# Patient Record
Sex: Male | Born: 1985 | Race: White | Hispanic: No | Marital: Single | Smoking: Never smoker
Health system: Southern US, Community
[De-identification: ages and names within clinical notes are randomized; demographics above are authoritative.]

## PROBLEM LIST (undated history)

## (undated) DIAGNOSIS — Q278 Other specified congenital malformations of peripheral vascular system: Secondary | ICD-10-CM

## (undated) HISTORY — DX: Other specified congenital malformations of peripheral vascular system: Q27.8

## (undated) HISTORY — PX: COLONOSCOPY: SHX174

---

## 2011-06-02 ENCOUNTER — Encounter (HOSPITAL_BASED_OUTPATIENT_CLINIC_OR_DEPARTMENT_OTHER): Payer: Self-pay | Admitting: Emergency Medicine

## 2011-06-02 ENCOUNTER — Emergency Department (HOSPITAL_BASED_OUTPATIENT_CLINIC_OR_DEPARTMENT_OTHER)
Admission: EM | Admit: 2011-06-02 | Discharge: 2011-06-02 | Disposition: A | Discharge status: Home / Self Care | Payer: BC Managed Care – PPO | Attending: Emergency Medicine | Admitting: Emergency Medicine

## 2011-06-02 DIAGNOSIS — R5383 Other fatigue: Secondary | ICD-10-CM | POA: Insufficient documentation

## 2011-06-02 DIAGNOSIS — R5381 Other malaise: Secondary | ICD-10-CM | POA: Insufficient documentation

## 2011-06-02 DIAGNOSIS — R002 Palpitations: Secondary | ICD-10-CM | POA: Insufficient documentation

## 2011-06-02 LAB — COMPREHENSIVE METABOLIC PANEL
ALT: 17 U/L (ref 0–53)
AST: 17 U/L (ref 0–37)
Albumin: 4.6 g/dL (ref 3.5–5.2)
Alkaline Phosphatase: 53 U/L (ref 39–117)
CO2: 28 mEq/L (ref 19–32)
Chloride: 101 mEq/L (ref 96–112)
GFR calc non Af Amer: >90 mL/min (ref >90)
Potassium: 3.8 mEq/L (ref 3.5–5.1)
Total Bilirubin: 0.9 mg/dL (ref 0.3–1.2)

## 2011-06-02 LAB — DIFFERENTIAL
Basophils Absolute: 0 10*3/uL (ref 0.0–0.1)
Basophils Relative: 0 % (ref 0–1)
Lymphocytes Relative: 42 % (ref 12–46)
Monocytes Relative: 8 % (ref 3–12)
Neutro Abs: 3.1 10*3/uL (ref 1.7–7.7)
Neutrophils Relative %: 49 % (ref 43–77)

## 2011-06-02 LAB — CBC
HCT: 43.6 % (ref 39.0–52.0)
Hemoglobin: 15.4 g/dL (ref 13.0–17.0)
MCHC: 35.3 g/dL (ref 30.0–36.0)
RDW: 11.9 % (ref 11.5–15.5)
WBC: 6.4 10*3/uL (ref 4.0–10.5)

## 2011-06-02 NOTE — ED Provider Notes (Signed)
History     CSN: [id number]  Arrival date & time 06/02/11  1715   First MD Initiated Contact with Patient 06/02/11 1821      Chief Complaint  Patient presents with  . Palpitations    (Consider location/radiation/quality/duration/timing/severity/associated sxs/prior treatment) HPI Comments: Noticed low heart rate last week and has been feeling irregularity.  Also feels weak and fatigued.    Patient is a 26 y.o. male presenting with palpitations. The history is provided by the patient.  Palpitations  This is a new problem. Episode onset: one week ago. The problem occurs constantly. The problem has been gradually worsening. The problem is associated with an unknown factor. Associated symptoms include malaise/fatigue and irregular heartbeat. Pertinent negatives include no diaphoresis, no fever and no chest pain. He has tried nothing for the symptoms.    History reviewed. No pertinent past medical history.  History reviewed. No pertinent past surgical history.  No family history on file.  History  Substance Use Topics  . Smoking status: Never Smoker   . Smokeless tobacco: Not on file  . Alcohol Use: No      Review of Systems  Constitutional: Positive for malaise/fatigue and fatigue. Negative for fever and diaphoresis.  Cardiovascular: Positive for palpitations. Negative for chest pain.  All other systems reviewed and are negative.    Allergies  Review of patient's allergies indicates no known allergies.  Home Medications  No current outpatient prescriptions on file.  BP 132/85  Pulse 79  Temp(Src) 97.6 F (36.4 C) (Oral)  Resp 16  Ht 6' (1.829 m)  Wt 165 lb (74.844 kg)  BMI 22.38 kg/m2  SpO2 100%  Physical Exam  Nursing note and vitals reviewed. Constitutional: He is oriented to person, place, and time. He appears well-developed and well-nourished. No distress.  HENT:  Head: Normocephalic and atraumatic.  Neck: Normal range of motion. Neck supple.    Cardiovascular: Normal rate, regular rhythm and normal heart sounds.   No murmur heard. Pulmonary/Chest: Effort normal and breath sounds normal. No respiratory distress. He has no wheezes.  Abdominal: Soft. Bowel sounds are normal. He exhibits no distension. There is no tenderness.  Musculoskeletal: Normal range of motion. He exhibits no edema.  Neurological: He is alert and oriented to person, place, and time. No cranial nerve deficit.  Skin: Skin is warm and dry. He is not diaphoretic.    ED Course  Procedures (including critical care time)   Labs Reviewed  CBC  DIFFERENTIAL  COMPREHENSIVE METABOLIC PANEL  TSH   No results found.   No diagnosis found.   Date: 06/02/2011  Rate: 63  Rhythm: normal sinus rhythm  QRS Axis: normal  Intervals: normal  ST/T Wave abnormalities: normal  Conduction Disutrbances:none  Narrative Interpretation:   Old EKG Reviewed: none available    MDM  The labs and ekg look okay.  There is no evidence of arrhythmia on telemetry.  He will discharged to home, pending tsh.          Geoffery Lyons, MD 06/02/11 2012

## 2011-06-02 NOTE — ED Notes (Addendum)
Pt started having palpitations while working out at the gym 3 months ago.  Denies pain.  Some weakness. Pt noticed his HR at 47 this weekend and got concerned. Feels like his heart beat is irregular.  Skipped beats.  Decided it was time to get checked.

## 2011-06-02 NOTE — Discharge Instructions (Signed)
Palpitations  A palpitation is the feeling that your heartbeat is irregular or is faster than normal. Although this is frightening, it usually is not serious. Palpitations may be caused by excesses of smoking, caffeine, or alcohol. They are also brought on by stress and anxiety. Sometimes, they are caused by heart disease. Unless otherwise noted, your caregiver did not find any signs of serious illness at this time. HOME CARE INSTRUCTIONS  To help prevent palpitations:  Drink decaffeinated coffee, tea, and soda pop. Avoid chocolate.   If you smoke or drink alcohol, quit or cut down as much as possible.   Reduce your stress or anxiety level. Biofeedback, yoga, or meditation will help you relax. Physical activity such as swimming, jogging, or walking also may be helpful.  SEEK MEDICAL CARE IF:   You continue to have a fast heartbeat.   Your palpitations occur more often.  SEEK IMMEDIATE MEDICAL CARE IF: You develop chest pain, shortness of breath, severe headache, dizziness, or fainting. Document Released: 01/29/2000 Document Revised: 01/20/2011 Document Reviewed: 03/30/2007 Exit[employer] Patient Information 2012 [employer]. 

## 2011-06-02 NOTE — ED Notes (Signed)
C/o palpitations working out at gym 3 months ago. Feels shortness of breath on occasion, sometimes feels his heart beat more rapidly at times, checked heart rate with app on phone. Feels fatigue and weakness, denies chest pain, dizziness, nausea, vomiting or diarrhea.

## 2012-05-04 ENCOUNTER — Ambulatory Visit (INDEPENDENT_AMBULATORY_CARE_PROVIDER_SITE_OTHER): Payer: BC Managed Care – PPO | Admitting: Family Medicine

## 2012-05-04 ENCOUNTER — Encounter: Payer: Self-pay | Admitting: Family Medicine

## 2012-05-04 ENCOUNTER — Telehealth: Payer: Self-pay | Admitting: Family Medicine

## 2012-05-04 VITALS — BP 123/72 | HR 90 | Ht 71.0 in | Wt 171.0 lb

## 2012-05-04 DIAGNOSIS — Z131 Encounter for screening for diabetes mellitus: Secondary | ICD-10-CM

## 2012-05-04 DIAGNOSIS — Z13220 Encounter for screening for lipoid disorders: Secondary | ICD-10-CM

## 2012-05-04 DIAGNOSIS — Q2732 Arteriovenous malformation of vessel of lower limb: Secondary | ICD-10-CM | POA: Insufficient documentation

## 2012-05-04 DIAGNOSIS — Z0000 Encounter for general adult medical examination without abnormal findings: Secondary | ICD-10-CM

## 2012-05-04 DIAGNOSIS — Q278 Other specified congenital malformations of peripheral vascular system: Secondary | ICD-10-CM

## 2012-05-04 NOTE — Telephone Encounter (Signed)
Sue Lush, Will you please let Mr. Counsell know that since his mom also had a arteriovenous malformation he is at increased risk for having arteriovenous malformations internally and not just on the knee.  I'd like him to see a vascular specialist to discuss if there's any further workup that needs to be done to ensure there aren't any more of these hiding internally.  It's not emergent, this is more to prevent any surprises that would affect his health well in the future.  A referral has been placed.

## 2012-05-04 NOTE — Telephone Encounter (Signed)
Left detailed message on vm.

## 2012-05-04 NOTE — Progress Notes (Signed)
CC: Troy Oconnell is a 27 y.o. male is here for Establish Care  Colonoscopy: No family history colon Cancer, screening to start age 5 Prostate: Discussed screening risks/beneifts with patient on 05/04/2012. No family history of prostate cancer, we'll consider screening age 80  Influenza Vaccine: Out of season Pneumovax: not indicated Td/Tdap: Td 2008 therefore up-to-date  Subjective: HPI:  Patient presents for complete physical exam without complaints.  Exercises regularly most days of the week, eating a varied diet low in saturated fats plentiful with vegetables and fiber.  Rare alcohol use, no tobacco or recreational drug use  Over the past 2 weeks have you been bothered by: - Little interest or pleasure in doing things: no - Feeling down depressed or hopeless: no  Review of Systems - General ROS: negative for - chills, fever, night sweats, weight gain or weight loss Ophthalmic ROS: negative for - decreased vision Psychological ROS: negative for - anxiety or depression ENT ROS: negative for - hearing change, nasal congestion, tinnitus or allergies Hematological and Lymphatic ROS: negative for - bleeding problems, bruising or swollen lymph nodes Breast ROS: negative Respiratory ROS: no cough, shortness of breath, or wheezing Cardiovascular ROS: no chest pain or dyspnea on exertion Gastrointestinal ROS: no abdominal pain, change in bowel habits, or black or bloody stools Genito-Urinary ROS: negative for - genital discharge, genital ulcers, incontinence or abnormal bleeding from genitals Musculoskeletal ROS: negative for - joint pain or muscle pain Neurological ROS: negative for - headaches or memory loss Dermatological ROS: negative for lumps, mole changes, rash and skin lesion changes  History reviewed. No pertinent past medical history.   Family History  Problem Relation Age of Onset  . AVM Mother      History  Substance Use Topics  . Smoking status: Never Smoker   .  Smokeless tobacco: Not on file  . Alcohol Use: No     Objective: Filed Vitals:   05/04/12 1424  BP: 123/72  Pulse: 90   General: No Acute Distress HEENT: Atraumatic, normocephalic, conjunctivae normal without scleral icterus.  No nasal discharge, hearing grossly intact, TMs with good landmarks bilaterally with no middle ear abnormalities, posterior pharynx clear without oral lesions. Neck: Supple, trachea midline, no cervical nor supraclavicular adenopathy. Pulmonary: Clear to auscultation bilaterally without wheezing, rhonchi, nor rales. Cardiac: Regular rate and rhythm.  No murmurs, rubs, nor gallops. No peripheral edema.  2+ peripheral pulses bilaterally. Abdomen: Bowel sounds normal.  No masses.  Non-tender without rebound.  Negative Murphy's sign. GU: Deferred by patient MSK: Grossly intact, no signs of weakness.  Full strength throughout upper and lower extremities.  Full ROM in upper and lower extremities.  No midline spinal tenderness. Neuro: Gait unremarkable, CN II-XII grossly intact.  C5-C6 Reflex 2/4 Bilaterally, L4 Reflex 2/4 Bilaterally.  Cerebellar function intact. Skin: No rashes. 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  Thought process normal. No anxiety/depression.  Assessment & Plan: [name] was seen today for establish care.  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 I encouraged him to  do self  testicular exam on a monthly basis we discussed how to properly do this.  Given a family history of AVM and his enlarging AVM on the surface of his knee I would like for him to be seen by vascular surgery to help determine if he needs any workup for internal AVMs.  Has never been screened for type 2 diabetes nor dyslipidemia therefore will obtain this fasting at his convenience  Return in about 3 months (around 08/04/2012).

## 2012-05-04 NOTE — Patient Instructions (Addendum)
Dr. Rosabella Edgin's General Advice Following Your Complete Physical Exam  The Benefits of Regular Exercise: Unless you suffer from an uncontrolled cardiovascular condition, studies strongly suggest that regular exercise and physical activity will add to both the quality and length of your life.  The World Health Organization recommends 150 minutes of moderate intensity aerobic activity every week.  This is best split over 3-4 days a week, and can be as simple as a brisk walk for just over 35 minutes "most days of the week".  This type of exercise has been shown to lower LDL-Cholesterol, lower average blood sugars, lower blood pressure, lower cardiovascular disease risk, improve memory, and increase one's overall sense of wellbeing.  The addition of anaerobic (or "strength training") exercises offers additional benefits including but not limited to increased metabolism, prevention of osteoporosis, and improved overall cholesterol levels.  How Can I Strive For A Low-Fat Diet?: Current guidelines recommend that 25-35 percent of your daily energy (food) intake should come from fats.  One might ask how can this be achieved without having to dissect each meal on a daily basis?  Switch to skim or 1% milk instead of whole milk.  Focus on lean meats such as ground turkey, fresh fish, baked chicken, and lean cuts of beef as your source of dietary protein.  Consume less than 300mg/day of dietary cholesterol.  Limit trans fatty acid consumption primarily by limiting synthetic trans fats such as partially hydrogenated oils (Ex: fried fast foods).  Focus efforts on reducing your intake of "solid" fats (Ex: Butter).  Substitute olive or vegetable oil for solid fats where possible.  Moderation of Salt Intake: Provided you don't carry a diagnosis of congestive heart failure nor renal failure, I recommend a daily allowance of no more than 2300 mg of salt (sodium).  Keeping under this daily goal is associated with a  decreased risk of cardiovascular events, creeping above it can lead to elevated blood pressures and increases your risk of cardiovascular events.  Milligrams (mg) of salt is listed on all nutrition labels, and your daily intake can add up faster than you think.  Most canned and frozen dinners can pack in over half your daily salt allowance in one meal.    Lifestyle Health Risks: Certain lifestyle choices carry specific health risks.  As you may already know, tobacco use has been associated with increasing one's risk of cardiovascular disease, pulmonary disease, numerous cancers, among many other issues.  What you may not know is that there are medications and nicotine replacement strategies that can more than double your chances of successfully quitting.  I would be thrilled to help manage your quitting strategy if you currently use tobacco products.  When it comes to alcohol use, I've yet to find an "ideal" daily allowance.  Provided an individual does not have a medical condition that is exacerbated by alcohol consumption, general guidelines determine "safe drinking" as no more than two standard drinks for a man or no more than one standard drink for a male per day.  However, much debate still exists on whether any amount of alcohol consumption is technically "safe".  My general advice, keep alcohol consumption to a minimum for general health promotion.  If you or others believe that alcohol, tobacco, or recreational drug use is interfering with your life, I would be happy to provide confidential counseling regarding treatment options.  General "Over The Counter" Nutrition Advice: Postmenopausal women should aim for a daily calcium intake of 1200 mg, however a significant   portion of this might already be provided by diets including milk, yogurt, cheese, and other dairy products.  Vitamin D has been shown to help preserve bone density, prevent fatigue, and has even been shown to help reduce falls in the  elderly.  Ensuring a daily intake of 800 Units of Vitamin D is a good place to start to enjoy the above benefits, we can easily check your Vitamin D level to see if you'd potentially benefit from supplementation beyond 800 Units a day.  Folic Acid intake should be of particular concern to women of childbearing age.  Daily consumption of 400-800 mcg of Folic Acid is recommended to minimize the chance of spinal cord defects in a fetus should pregnancy occur.    For many adults, accidents still remain one of the most common culprits when it comes to cause of death.  Some of the simplest but most effective preventitive habits you can adopt include regular seatbelt use, proper helmet use, securing firearms, and regularly testing your smoke and carbon monoxide detectors.  Jamesina Gaugh B. Malaki Koury DO [facility] [address], [address] [place], [place] [address] Phone: [phone]  

## 2012-05-30 ENCOUNTER — Encounter: Payer: Self-pay | Admitting: Vascular Surgery

## 2012-06-04 ENCOUNTER — Encounter: Payer: Self-pay | Admitting: Vascular Surgery

## 2012-06-04 LAB — BASIC METABOLIC PANEL WITH GFR
BUN: 12 mg/dL (ref 6–23)
GFR, Est African American: >89 mL/min
GFR, Est Non African American: >89 mL/min
Potassium: 4.2 mEq/L (ref 3.5–5.3)
Sodium: 141 mEq/L (ref 135–145)

## 2012-06-04 LAB — LIPID PANEL
LDL Cholesterol: 94 mg/dL (ref 0–99)
Total CHOL/HDL Ratio: 3.6 Ratio
VLDL: 23 mg/dL (ref 0–40)

## 2012-06-05 ENCOUNTER — Encounter: Payer: Self-pay | Admitting: Vascular Surgery

## 2012-06-05 ENCOUNTER — Ambulatory Visit (INDEPENDENT_AMBULATORY_CARE_PROVIDER_SITE_OTHER): Payer: BC Managed Care – PPO | Admitting: Vascular Surgery

## 2012-06-05 VITALS — BP 118/79 | HR 57 | Resp 14 | Ht 71.0 in | Wt 170.0 lb

## 2012-06-05 DIAGNOSIS — S81001A Unspecified open wound, right knee, initial encounter: Secondary | ICD-10-CM

## 2012-06-05 DIAGNOSIS — S81009A Unspecified open wound, unspecified knee, initial encounter: Secondary | ICD-10-CM | POA: Insufficient documentation

## 2012-06-05 DIAGNOSIS — S81809A Unspecified open wound, unspecified lower leg, initial encounter: Secondary | ICD-10-CM

## 2012-06-05 DIAGNOSIS — S91009A Unspecified open wound, unspecified ankle, initial encounter: Secondary | ICD-10-CM

## 2012-06-05 NOTE — Progress Notes (Signed)
The patient presents today for evaluation of a small superficial AV malformation on his right knee. He reports this is been present since birth. He has no pain or swelling associated with this. He has no other evidence of malformations. He has had a number of occasions where he had significant bleeding from superficial abrasion over this AV malformation. Most recently he had several different episodes over the course of one to 2 weeks this was about 6 months ago. He does not have any lower extremity swelling or discomfort..  Past Medical History  Diagnosis Date  . Congenital lower limb vessel anomaly     Arteriovenous malformation of lower extremity    History  Substance Use Topics  . Smoking status: Never Smoker   . Smokeless tobacco: Never Used  . Alcohol Use: No    Family History  Problem Relation Age of Onset  . AVM Mother     No Known Allergies  No current outpatient prescriptions on file.  BP 118/79  Pulse 57  Resp 14  Ht 5\' 11"  (1.803 m)  Wt 170 lb (77.111 kg)  BMI 23.72 kg/m2  SpO2 100%  Body mass index is 23.72 kg/(m^2).       Review of systems is totally negative aside from bleeding from this area.  Physical exam: Well-developed well-nourished gentleman appearing stated age in no distress Neurologically he is grossly intact Skin without ulcers or rashes. Does have the area of concern on his right knee Pulse status 2+ radial 2+ dorsalis pedis pulses bilaterally Psychologic normal affect. Respirations nonlabored He does have a proximally 2 cm area of well-demarcated a reddish AV malformation. This has a slight area of raised above the skin surface on the upper and medial aspect. Does have some palpable plexus of veins underneath this.  I imaged this area with SonoSite ultrasound does show plexus of veins beneath this. These are and not particularly enlarged and there does not appear to be any other extension of this with his screening exam.  Impression and  plan superficial small AV malformation on the right knee. I reassured the patient that this should be self-limited aggravation more than any other problem. I did explain that he had recurrent evidence absence of bleeding that this could potentially be treated with sclerotherapy of the veins underlying this. I explained that I had not done this before but these a small vein should be treatable by this method. 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 I would certain feel comfortable with observation only since he does not have any other issue he will notify should he develop recurrent bleeding episodes or other concerns or so we will attempt sclerotherapy. He was reassured this discussion

## 2012-12-20 ENCOUNTER — Other Ambulatory Visit: Payer: Self-pay

## 2013-01-29 ENCOUNTER — Other Ambulatory Visit: Payer: Self-pay | Admitting: Family Medicine

## 2013-01-29 ENCOUNTER — Ambulatory Visit (INDEPENDENT_AMBULATORY_CARE_PROVIDER_SITE_OTHER): Payer: BC Managed Care – PPO | Admitting: Family Medicine

## 2013-01-29 ENCOUNTER — Encounter: Payer: Self-pay | Admitting: Family Medicine

## 2013-01-29 VITALS — BP 121/70 | HR 73 | Temp 97.6°F | Wt 165.0 lb

## 2013-01-29 DIAGNOSIS — R300 Dysuria: Secondary | ICD-10-CM

## 2013-01-29 DIAGNOSIS — N390 Urinary tract infection, site not specified: Secondary | ICD-10-CM

## 2013-01-29 LAB — POCT URINALYSIS DIPSTICK
Glucose, UA: NEGATIVE
Nitrite, UA: NEGATIVE
Urobilinogen, UA: 0.2

## 2013-01-29 MED ORDER — CIPROFLOXACIN HCL 500 MG PO TABS: 500.0000 mg | ORAL_TABLET | Freq: Two times a day (BID) | ORAL | Status: AC

## 2013-01-29 NOTE — Addendum Note (Signed)
Addended by: Wyline Beady on: 01/29/2013 12:41 PM   Modules accepted: Orders

## 2013-01-29 NOTE — Progress Notes (Signed)
CC: Troy Oconnell is a 27 y.o. male is here for Dysuria   Subjective: HPI:  Complains of one week of dysuria localized to the tip of the penis worse first thing in the morning and improves to more water he drinks throughout the day. He does admit that the week prior to onset of symptoms he was drinking very little fluids on a daily basis urinating to-3 times in a 24-hour period. Has been accompanied by clear discharge at the tip of the penis which comes and goes over the past 2 weeks. Dysuria is described as burning which is nonradiating. Denies genital lesions, testicular pain or swelling, groin pain, rectal pain, nor discomfort in the saddle region. He has been sexually active but does not know of any known STI encounters. Denies flank pain, fevers, chills, abdominal pain, pelvic pain, nor night sweats.   Review Of Systems Outlined In HPI  Past Medical History  Diagnosis Date  . Congenital lower limb vessel anomaly     Arteriovenous malformation of lower extremity     Family History  Problem Relation Age of Onset  . AVM Mother      History  Substance Use Topics  . Smoking status: Never Smoker   . Smokeless tobacco: Never Used  . Alcohol Use: No     Objective: Filed Vitals:   01/29/13 1121  BP: 121/70  Pulse: 73  Temp: 97.6 F (36.4 C)    Vital signs reviewed. General: Alert and Oriented, No Acute Distress HEENT: Pupils equal, round, reactive to light. Conjunctivae clear.  External ears unremarkable.  Moist mucous membranes. Lungs: Clear and comfortable work of breathing, speaking in full sentences without accessory muscle use. Cardiac: Regular rate and rhythm.  Neuro: CN II-XII grossly intact, gait normal. Extremities: No peripheral edema.  Strong peripheral pulses.  Mental Status: No depression, anxiety, nor agitation. Logical though process. Skin: Warm and dry. Assessment & Plan: Troy Oconnell was seen today for dysuria.  Diagnoses and associated orders for this  visit:  Dysuria - Urinalysis Dipstick - Urine Culture  UTI (lower urinary tract infection) - ciprofloxacin (CIPRO) 500 MG tablet; Take 1 tablet (500 mg total) by mouth 2 (two) times daily.    Dysuria: Urinalysis with trace urine and leukocytes suspicious for urinary tract infection we'll cover for enteric bacteria pending gonorrhea and Chlamydia probe, will follow urine culture.  Low suspicion of prostatis.  Return if symptoms worsen or fail to improve.

## 2013-01-30 LAB — URINE CULTURE: Organism ID, Bacteria: NO GROWTH

## 2013-01-31 ENCOUNTER — Encounter: Payer: Self-pay | Admitting: Family Medicine

## 2013-02-01 ENCOUNTER — Telehealth: Payer: Self-pay | Admitting: Family Medicine

## 2013-02-01 DIAGNOSIS — A749 Chlamydial infection, unspecified: Secondary | ICD-10-CM

## 2013-02-01 MED ORDER — AZITHROMYCIN 500 MG PO TABS: ORAL_TABLET | ORAL | Status: DC

## 2013-02-01 NOTE — Telephone Encounter (Signed)
Pt.notified

## 2013-02-01 NOTE — Telephone Encounter (Signed)
Sue Lush, Will you please let Troy Oconnell know that his Chlamydia test came back positive, this totally explains all of his symptoms.  Cipro does not work for this bacteria, he'll only need a single dose of two azithromycin pills that I sent to his [facility].  He should be feeling much better by Sunday/Monday

## 2013-02-01 NOTE — Telephone Encounter (Signed)
They are faxing the results now.

## 2013-02-01 NOTE — Telephone Encounter (Signed)
Sue Lush, Can you please check with [facility] to see if they can confirm that they are running a test for chlamydia that was added yesterday.  I don't see any evidence of it yet.

## 2013-05-04 ENCOUNTER — Encounter: Payer: Self-pay | Admitting: Family Medicine

## 2013-06-14 ENCOUNTER — Ambulatory Visit: Payer: Self-pay | Admitting: Family Medicine

## 2013-09-10 ENCOUNTER — Ambulatory Visit (INDEPENDENT_AMBULATORY_CARE_PROVIDER_SITE_OTHER): Payer: BC Managed Care – PPO | Admitting: Family Medicine

## 2013-09-10 ENCOUNTER — Encounter: Payer: Self-pay | Admitting: Family Medicine

## 2013-09-10 VITALS — BP 127/64 | HR 64 | Ht 71.0 in | Wt 163.0 lb

## 2013-09-10 DIAGNOSIS — L821 Other seborrheic keratosis: Secondary | ICD-10-CM

## 2013-09-10 DIAGNOSIS — Z0000 Encounter for general adult medical examination without abnormal findings: Secondary | ICD-10-CM

## 2013-09-10 NOTE — Patient Instructions (Signed)
Dr. Marcelle Hepner's General Advice Following Your Complete Physical Exam  The Benefits of Regular Exercise: Unless you suffer from an uncontrolled cardiovascular condition, studies strongly suggest that regular exercise and physical activity will add to both the quality and length of your life.  The World Health Organization recommends 150 minutes of moderate intensity aerobic activity every week.  This is best split over 3-4 days a week, and can be as simple as a brisk walk for just over 35 minutes "most days of the week".  This type of exercise has been shown to lower LDL-Cholesterol, lower average blood sugars, lower blood pressure, lower cardiovascular disease risk, improve memory, and increase one's overall sense of wellbeing.  The addition of anaerobic (or "strength training") exercises offers additional benefits including but not limited to increased metabolism, prevention of osteoporosis, and improved overall cholesterol levels.  How Can I Strive For A Low-Fat Diet?: Current guidelines recommend that 25-35 percent of your daily energy (food) intake should come from fats.  One might ask how can this be achieved without having to dissect each meal on a daily basis?  Switch to skim or 1% milk instead of whole milk.  Focus on lean meats such as ground turkey, fresh fish, baked chicken, and lean cuts of beef as your source of dietary protein.  Limit saturated fat consumption to less than 10% of your daily caloric intake.  Limit trans fatty acid consumption primarily by limiting synthetic trans fats such as partially hydrogenated oils (Ex: fried fast foods).  Substitute olive or vegetable oil for solid fats where possible.  Moderation of Salt Intake: Provided you don't carry a diagnosis of congestive heart failure nor renal failure, I recommend a daily allowance of no more than 2300 mg of salt (sodium).  Keeping under this daily goal is associated with a decreased risk of cardiovascular events, creeping  above it can lead to elevated blood pressures and increases your risk of cardiovascular events.  Milligrams (mg) of salt is listed on all nutrition labels, and your daily intake can add up faster than you think.  Most canned and frozen dinners can pack in over half your daily salt allowance in one meal.    Lifestyle Health Risks: Certain lifestyle choices carry specific health risks.  As you may already know, tobacco use has been associated with increasing one's risk of cardiovascular disease, pulmonary disease, numerous cancers, among many other issues.  What you may not know is that there are medications and nicotine replacement strategies that can more than double your chances of successfully quitting.  I would be thrilled to help manage your quitting strategy if you currently use tobacco products.  When it comes to alcohol use, I've yet to find an "ideal" daily allowance.  Provided an individual does not have a medical condition that is exacerbated by alcohol consumption, general guidelines determine "safe drinking" as no more than two standard drinks for a man or no more than one standard drink for a male per day.  However, much debate still exists on whether any amount of alcohol consumption is technically "safe".  My general advice, keep alcohol consumption to a minimum for general health promotion.  If you or others believe that alcohol, tobacco, or recreational drug use is interfering with your life, I would be happy to provide confidential counseling regarding treatment options.  General "Over The Counter" Nutrition Advice: Postmenopausal women should aim for a daily calcium intake of 1200 mg, however a significant portion of this might already be   provided by diets including milk, yogurt, cheese, and other dairy products.  Vitamin D has been shown to help preserve bone density, prevent fatigue, and has even been shown to help reduce falls in the elderly.  Ensuring a daily intake of 800 Units of  Vitamin D is a good place to start to enjoy the above benefits, we can easily check your Vitamin D level to see if you'd potentially benefit from supplementation beyond 800 Units a day.  Folic Acid intake should be of particular concern to women of childbearing age.  Daily consumption of 400-800 mcg of Folic Acid is recommended to minimize the chance of spinal cord defects in a fetus should pregnancy occur.    For many adults, accidents still remain one of the most common culprits when it comes to cause of death.  Some of the simplest but most effective preventitive habits you can adopt include regular seatbelt use, proper helmet use, securing firearms, and regularly testing your smoke and carbon monoxide detectors.  Senora Lacson B. Quinteria Chisum DO [facility] [address], [address] [place], [place] [address] Phone: [phone]  

## 2013-09-10 NOTE — Progress Notes (Signed)
CC: Barb MerinoMohammad Oconnell is a 28 y.o. male is here for Annual Exam   Subjective: HPI:  Colonoscopy: No family history of colon cancer will begin screening at age 28 Prostate: Discussed screening risks/beneifts with patient today, will begin at age 28. No family history of prostate cancer  Influenza Vaccine: Encouraged him to consider having this done the flu season arrives Pneumovax: No current indication Td/Tdap: Up-to-date as of 2008 Zoster: (Start 26 yo)  Requesting complete physical exam with concerns about a mole on his back that has been present for an undetermined amount of time is unsure but has been getting bigger or worse but it is painless.  No alcohol tobacco or recreational drug use  Review of Systems - General ROS: negative for - chills, fever, night sweats, weight gain or weight loss Ophthalmic ROS: negative for - decreased vision Psychological ROS: negative for - anxiety or depression ENT ROS: negative for - hearing change, nasal congestion, tinnitus or allergies Hematological and Lymphatic ROS: negative for - bleeding problems, bruising or swollen lymph nodes Breast ROS: negative Respiratory ROS: no cough, shortness of breath, or wheezing Cardiovascular ROS: no chest pain or dyspnea on exertion Gastrointestinal ROS: no abdominal pain, change in bowel habits, or black or bloody stools Genito-Urinary ROS: negative for - genital discharge, genital ulcers, incontinence or abnormal bleeding from genitals Musculoskeletal ROS: negative for - joint pain or muscle pain Neurological ROS: negative for - headaches or memory loss Dermatological ROS: negative for lumps, mole changes, rash and skin lesion changes other than that described above  Past Medical History  Diagnosis Date  . Congenital lower limb vessel anomaly     Arteriovenous malformation of lower extremity    Past Surgical History  Procedure Laterality Date  . Colonoscopy     Family History  Problem Relation Age of  Onset  . AVM Mother     History   Social History  . Marital Status: Single    Spouse Name: N/A    Number of Children: N/A  . Years of Education: N/A   Occupational History  . Not on file.   Social History Main Topics  . Smoking status: Never Smoker   . Smokeless tobacco: Never Used  . Alcohol Use: No  . Drug Use: No  . Sexual Activity: Not on file   Other Topics Concern  . Not on file   Social History Narrative  . No narrative on file     Objective: BP 127/64  Pulse 64  Ht 5\' 11"  (1.803 m)  Wt 163 lb (73.936 kg)  BMI 22.74 kg/m2  General: No Acute Distress HEENT: Atraumatic, normocephalic, conjunctivae normal without scleral icterus.  No nasal discharge, hearing grossly intact, TMs with good landmarks bilaterally with no middle ear abnormalities, posterior pharynx clear without oral lesions. Neck: Supple, trachea midline, no cervical nor supraclavicular adenopathy. Pulmonary: Clear to auscultation bilaterally without wheezing, rhonchi, nor rales. Cardiac: Regular rate and rhythm.  No murmurs, rubs, nor gallops. No peripheral edema.  2+ peripheral pulses bilaterally. Abdomen: Bowel sounds normal.  No masses.  Non-tender without rebound.  Negative Murphy's sign. GU: Bilaterally descended testes no hernia MSK: Grossly intact, no signs of weakness.  Full strength throughout upper and lower extremities.  Full ROM in upper and lower extremities.  No midline spinal tenderness. Neuro: Gait unremarkable, CN II-XII grossly intact.  C5-C6 Reflex 2/4 Bilaterally, L4 Reflex 2/4 Bilaterally.  Cerebellar function intact. Skin: No rashes. Noninflamed seborrheic keratosis between the shoulder blades Psych: Alert and  oriented to person/place/time.  Thought process normal. No anxiety/depression.  Assessment & Plan: Sylvanus was seen today for annual exam.  Diagnoses and associated orders for this visit:  Annual physical exam  Seborrheic keratoses    Healthy lifestyle  interventions including but not limited to regular exercise, a healthy low fat diet, moderation of salt intake, the dangers of tobacco/alcohol/recreational drug use, nutrition supplementation, and accident avoidance were discussed with the patient and a handout was provided for future reference. Discussed benign nature of seborrheic keratosis and that if it is ever painful due to friction we can have it removed/destruction with cryotherapy   Return if symptoms worsen or fail to improve.

## 2014-08-19 ENCOUNTER — Emergency Department
Admission: EM | Admit: 2014-08-19 | Discharge: 2014-08-19 | Disposition: A | Discharge status: Home / Self Care | Payer: BLUE CROSS/BLUE SHIELD | Source: Home / Self Care | Attending: Emergency Medicine | Admitting: Emergency Medicine

## 2014-08-19 ENCOUNTER — Encounter: Payer: Self-pay | Admitting: *Deleted

## 2014-08-19 ENCOUNTER — Encounter: Payer: Self-pay | Admitting: Family Medicine

## 2014-08-19 DIAGNOSIS — M25422 Effusion, left elbow: Secondary | ICD-10-CM | POA: Diagnosis not present

## 2014-08-19 NOTE — ED Provider Notes (Signed)
CSN: [id number]     Arrival date & time 08/19/14  1819 History   First MD Initiated Contact with Patient 08/19/14 1833     Chief Complaint  Patient presents with  . Arm Pain   (Consider location/radiation/quality/duration/timing/severity/associated sxs/prior Treatment) Patient is a 29 y.o. male presenting with arm pain. The history is provided by the patient. No language interpreter was used.  Arm Pain This is a new problem. Episode onset: 6/23  The problem occurs constantly. The problem has been gradually improving. Nothing aggravates the symptoms. Nothing relieves the symptoms. He has tried nothing for the symptoms.  Pt reports he gave blood  Arm began swelling and bruising 2 days later.   Pt reports area in upper arm is swollen and tender.  Pt reports some tingling in his fingers.    Past Medical History  Diagnosis Date  . Congenital lower limb vessel anomaly     Arteriovenous malformation of lower extremity   Past Surgical History  Procedure Laterality Date  . Colonoscopy     Family History  Problem Relation Age of Onset  . AVM Mother    History  Substance Use Topics  . Smoking status: Never Smoker   . Smokeless tobacco: Never Used  . Alcohol Use: No    Review of Systems  Musculoskeletal: Positive for myalgias and joint swelling.  All other systems reviewed and are negative.   Allergies  Review of patient's allergies indicates no known allergies.  Home Medications   Prior to Admission medications   Not on File   BP 135/85 mmHg  Pulse 61  Temp(Src) 98.1 F (36.7 C) (Oral)  Resp 16  Ht 5\' 10"  (1.778 m)  Wt 170 lb (77.111 kg)  BMI 24.39 kg/m2  SpO2 100% Physical Exam  Constitutional: He is oriented to person, place, and time. He appears well-developed and well-nourished.  HENT:  Head: Normocephalic.  Cardiovascular: Normal rate.   Pulmonary/Chest: Effort normal.  Musculoskeletal: He exhibits tenderness.  Swollen left arm.  Tender bicep, swollen bruised  above blood draw site and below.  nv intact, good cap refill,    Neurological: He is alert and oriented to person, place, and time. He has normal reflexes.  Skin: Skin is warm.  Psychiatric: He has a normal mood and affect.    ED Course  Procedures (including critical care time) Labs Review Labs Reviewed - No data to display  Imaging Review No results found.   MDM   1. Swelling of joint of upper arm, left    Pt needs to return for ultrasound.   I think he probably has a superficial thrombophlebitis however swelling in upper arm is concerning. Pt advised asa tonight.  Warm compresses avs      Lonia SkinnerLeslie K Snow HillSofia, New JerseyPA-C 08/19/14 1857

## 2014-08-19 NOTE — Discharge Instructions (Signed)
Phlebitis °Phlebitis is soreness and swelling (inflammation) of a vein. This can occur in your arms, legs, or torso (trunk), as well as deeper inside your body. Phlebitis is usually not serious when it occurs close to the surface of the body. However, it can cause serious problems when it occurs in a vein deeper inside the body. °CAUSES  °Phlebitis can be triggered by various things, including:  °· Reduced blood flow through your veins. This can happen with: °¨ Bed rest over a long period. °¨ Long-distance travel. °¨ Injury. °¨ Surgery. °¨ Being overweight (obese) or pregnant. °· Having an IV tube put in the vein and getting certain medicines through the vein. °· Cancer and cancer treatment. °· Use of illegal drugs taken through the vein. °· Inflammatory diseases. °· Inherited (genetic) diseases that increase the risk of blood clots. °· Hormone therapy, such as birth control pills. °SIGNS AND SYMPTOMS  °· Red, tender, swollen, and painful area on your skin. Usually, the area will be long and narrow. °· Firmness along the center of the affected area. This can indicate that a blood clot has formed. °· Low-grade fever. °DIAGNOSIS  °A health care provider can usually diagnose phlebitis by examining the affected area and asking about your symptoms. To check for infection or blood clots, your health care provider may order blood tests or an ultrasound exam of the area. Blood tests and your family history may also indicate if you have an underlying genetic disease that causes blood clots. Occasionally, a piece of tissue is taken from the body (biopsy sample) if an unusual cause of phlebitis is suspected. °TREATMENT  °Treatment will vary depending on the severity of the condition and the area of the body affected. Treatment may include: °· Use of a warm compress or heating pad. °· Use of compression stockings or bandages. °· Anti-inflammatory medicines. °· Removal of any IV tube that may be causing the problem. °· Medicines  that kill germs (antibiotics) if an infection is present. °· Blood-thinning medicines if a blood clot is suspected or present. °· In rare cases, surgery may be needed to remove damaged sections of vein. °HOME CARE INSTRUCTIONS  °· Only take over-the-counter or prescription medicines as directed by your health care provider. Take all medicines exactly as prescribed. °· Raise (elevate) the affected area above the level of your heart as directed by your health care provider. °· Apply a warm compress or heating pad to the affected area as directed by your health care provider. Do not sleep with the heating pad. °· Use compression stockings or bandages as directed. These will speed healing and prevent the condition from coming back. °· If you are on blood thinners: °¨ Get follow-up blood tests as directed by your health care provider. °¨ Check with your health care provider before using any new medicines. °¨ Carry a medical alert card or wear your medical alert jewelry to show that you are on blood thinners. °· For phlebitis in the legs: °¨ Avoid prolonged standing or bed rest. °¨ Keep your legs moving. Raise your legs when sitting or lying. °· Do not smoke. °· Women, particularly those over the age of 35, should consider the risks and benefits of taking the contraceptive pill. This kind of hormone treatment can increase your risk for blood clots. °· Follow up with your health care provider as directed. °SEEK MEDICAL CARE IF:  °· You have unusual bruising or any bleeding problems. °· Your swelling or pain in the affected area   is not improving. °· You are on anti-inflammatory medicine, and you develop belly (abdominal) pain. °SEEK IMMEDIATE MEDICAL CARE IF:  °· You have a sudden onset of chest pain or difficulty breathing. °· You have a fever or persistent symptoms for more than 2-3 days. °· You have a fever and your symptoms suddenly get worse. °MAKE SURE YOU: °· Understand these instructions. °· Will watch your  condition. °· Will get help right away if you are not doing well or get worse. °Document Released: 01/25/2001 Document Revised: 11/21/2012 Document Reviewed: 10/08/2012 °[employer]® Patient Information ©2015 [employer]. This information is not intended to replace advice given to you by your health care provider. Make sure you discuss any questions you have with your health care provider. ° °

## 2014-08-19 NOTE — ED Notes (Addendum)
Pt c/o LT arm bruising and pain post blood draw on 08/07/14. He also reports doing forearm exercises 2 days after the blood draw.

## 2014-08-20 ENCOUNTER — Ambulatory Visit: Payer: BLUE CROSS/BLUE SHIELD

## 2014-08-20 ENCOUNTER — Telehealth: Payer: Self-pay | Admitting: Emergency Medicine

## 2015-03-18 ENCOUNTER — Ambulatory Visit: Payer: BLUE CROSS/BLUE SHIELD | Admitting: Family Medicine

## 2015-03-18 ENCOUNTER — Encounter: Payer: Self-pay | Admitting: Family Medicine

## 2015-03-18 NOTE — Progress Notes (Signed)
No show 03/18/2015

## 2015-03-27 ENCOUNTER — Ambulatory Visit (INDEPENDENT_AMBULATORY_CARE_PROVIDER_SITE_OTHER): Payer: BLUE CROSS/BLUE SHIELD | Admitting: Family Medicine

## 2015-03-27 ENCOUNTER — Encounter: Payer: Self-pay | Admitting: Family Medicine

## 2015-03-27 VITALS — BP 132/78 | HR 60 | Wt 178.0 lb

## 2015-03-27 DIAGNOSIS — Z0000 Encounter for general adult medical examination without abnormal findings: Secondary | ICD-10-CM | POA: Diagnosis not present

## 2015-03-27 LAB — COMPLETE METABOLIC PANEL WITH GFR
ALBUMIN: 4.4 g/dL (ref 3.6–5.1)
ALK PHOS: 58 U/L (ref 40–115)
ALT: 19 U/L (ref 9–46)
AST: 19 U/L (ref 10–40)
BILIRUBIN TOTAL: 1.3 mg/dL — AB (ref 0.2–1.2)
BUN: 16 mg/dL (ref 7–25)
CO2: 26 mmol/L (ref 20–31)
Calcium: 9.5 mg/dL (ref 8.6–10.3)
Chloride: 101 mmol/L (ref 98–110)
Creat: 0.96 mg/dL (ref 0.60–1.35)
GFR, Est African American: >89 mL/min (ref >=60)
GLUCOSE: 89 mg/dL (ref 65–99)
POTASSIUM: 4 mmol/L (ref 3.5–5.3)
SODIUM: 135 mmol/L (ref 135–146)
TOTAL PROTEIN: 7.7 g/dL (ref 6.1–8.1)

## 2015-03-27 LAB — CBC
HCT: 42.6 % (ref 39.0–52.0)
HEMOGLOBIN: 14.2 g/dL (ref 13.0–17.0)
MCH: 28.9 pg (ref 26.0–34.0)
MCHC: 33.3 g/dL (ref 30.0–36.0)
MCV: 86.6 fL (ref 78.0–100.0)
MPV: 11 fL (ref 8.6–12.4)
PLATELETS: 200 10*3/uL (ref 150–400)
RBC: 4.92 MIL/uL (ref 4.22–5.81)
RDW: 13 % (ref 11.5–15.5)
WBC: 4.1 10*3/uL (ref 4.0–10.5)

## 2015-03-27 LAB — LIPID PANEL
Cholesterol: 155 mg/dL (ref 125–200)
HDL: 60 mg/dL (ref >=40)
LDL CALC: 87 mg/dL (ref <130)
Total CHOL/HDL Ratio: 2.6 Ratio (ref <=5.0)
Triglycerides: 41 mg/dL (ref <150)
VLDL: 8 mg/dL (ref <30)

## 2015-03-27 NOTE — Patient Instructions (Signed)
Dr. Ronzell Laban's General Advice Following Your Complete Physical Exam  The Benefits of Regular Exercise: Unless you suffer from an uncontrolled cardiovascular condition, studies strongly suggest that regular exercise and physical activity will add to both the quality and length of your life.  The World Health Organization recommends 150 minutes of moderate intensity aerobic activity every week.  This is best split over 3-4 days a week, and can be as simple as a brisk walk for just over 35 minutes "most days of the week".  This type of exercise has been shown to lower LDL-Cholesterol, lower average blood sugars, lower blood pressure, lower cardiovascular disease risk, improve memory, and increase one's overall sense of wellbeing.  The addition of anaerobic (or "strength training") exercises offers additional benefits including but not limited to increased metabolism, prevention of osteoporosis, and improved overall cholesterol levels.  How Can I Strive For A Low-Fat Diet?: Current guidelines recommend that 25-35 percent of your daily energy (food) intake should come from fats.  One might ask how can this be achieved without having to dissect each meal on a daily basis?  Switch to skim or 1% milk instead of whole milk.  Focus on lean meats such as ground turkey, fresh fish, baked chicken, and lean cuts of beef as your source of dietary protein.  Limit saturated fat consumption to less than 10% of your daily caloric intake.  Limit trans fatty acid consumption primarily by limiting synthetic trans fats such as partially hydrogenated oils (Ex: fried fast foods).  Substitute olive or vegetable oil for solid fats where possible.  Moderation of Salt Intake: Provided you don't carry a diagnosis of congestive heart failure nor renal failure, I recommend a daily allowance of no more than 2300 mg of salt (sodium).  Keeping under this daily goal is associated with a decreased risk of cardiovascular events, creeping  above it can lead to elevated blood pressures and increases your risk of cardiovascular events.  Milligrams (mg) of salt is listed on all nutrition labels, and your daily intake can add up faster than you think.  Most canned and frozen dinners can pack in over half your daily salt allowance in one meal.    Lifestyle Health Risks: Certain lifestyle choices carry specific health risks.  As you may already know, tobacco use has been associated with increasing one's risk of cardiovascular disease, pulmonary disease, numerous cancers, among many other issues.  What you may not know is that there are medications and nicotine replacement strategies that can more than double your chances of successfully quitting.  I would be thrilled to help manage your quitting strategy if you currently use tobacco products.  When it comes to alcohol use, I've yet to find an "ideal" daily allowance.  Provided an individual does not have a medical condition that is exacerbated by alcohol consumption, general guidelines determine "safe drinking" as no more than two standard drinks for a man or no more than one standard drink for a male per day.  However, much debate still exists on whether any amount of alcohol consumption is technically "safe".  My general advice, keep alcohol consumption to a minimum for general health promotion.  If you or others believe that alcohol, tobacco, or recreational drug use is interfering with your life, I would be happy to provide confidential counseling regarding treatment options.  General "Over The Counter" Nutrition Advice: Postmenopausal women should aim for a daily calcium intake of 1200 mg, however a significant portion of this might already be   provided by diets including milk, yogurt, cheese, and other dairy products.  Vitamin D has been shown to help preserve bone density, prevent fatigue, and has even been shown to help reduce falls in the elderly.  Ensuring a daily intake of 800 Units of  Vitamin D is a good place to start to enjoy the above benefits, we can easily check your Vitamin D level to see if you'd potentially benefit from supplementation beyond 800 Units a day.  Folic Acid intake should be of particular concern to women of childbearing age.  Daily consumption of 400-800 mcg of Folic Acid is recommended to minimize the chance of spinal cord defects in a fetus should pregnancy occur.    For many adults, accidents still remain one of the most common culprits when it comes to cause of death.  Some of the simplest but most effective preventitive habits you can adopt include regular seatbelt use, proper helmet use, securing firearms, and regularly testing your smoke and carbon monoxide detectors.  Murvin Gift B. Rontae Inglett DO [facility] [address], [address] [place], [place] [address] Phone: [phone]  

## 2015-03-27 NOTE — Progress Notes (Signed)
CC: Troy Oconnell is a 30 y.o. male is here for Annual Exam; Chest Pain; and Shortness of Breath   Subjective: HPI:  Colonoscopy: No indication, no family history Co Ca Prostate: Discussed screening risks/beneifts with patient today, no current indication for screening   Influenza Vaccine: declined Pneumovax: no indication Td/Tdap: UTD from 2008 Zoster: (Start 28 yo)  Requesting CPE with no complaints  Review of Systems - General ROS: negative for - chills, fever, night sweats, weight gain or weight loss Ophthalmic ROS: negative for - decreased vision Psychological ROS: negative for - anxiety or depression ENT ROS: negative for - hearing change, nasal congestion, tinnitus or allergies Hematological and Lymphatic ROS: negative for - bleeding problems, bruising or swollen lymph nodes Breast ROS: negative Respiratory ROS: no cough, shortness of breath, or wheezing Cardiovascular ROS: no chest pain or dyspnea on exertion Gastrointestinal ROS: no abdominal pain, change in bowel habits, or black or bloody stools Genito-Urinary ROS: negative for - genital discharge, genital ulcers, incontinence or abnormal bleeding from genitals Musculoskeletal ROS: negative for - joint pain or muscle pain Neurological ROS: negative for - headaches or memory loss Dermatological ROS: negative for lumps, mole changes, rash and skin lesion changes   Past Medical History  Diagnosis Date  . Congenital lower limb vessel anomaly     Arteriovenous malformation of lower extremity    Past Surgical History  Procedure Laterality Date  . Colonoscopy     Family History  Problem Relation Age of Onset  . AVM Mother     Social History   Social History  . Marital Status: Single    Spouse Name: N/A  . Number of Children: N/A  . Years of Education: N/A   Occupational History  . Not on file.   Social History Main Topics  . Smoking status: Never Smoker   . Smokeless tobacco: Never Used  . Alcohol Use:  No  . Drug Use: No  . Sexual Activity: Not on file   Other Topics Concern  . Not on file   Social History Narrative     Objective: BP 132/78 mmHg  Pulse 60  Wt 178 lb (80.74 kg)  SpO2 99%  General: No Acute Distress HEENT: Atraumatic, normocephalic, conjunctivae normal without scleral icterus.  No nasal discharge, hearing grossly intact, TMs with good landmarks bilaterally with no middle ear abnormalities, posterior pharynx clear without oral lesions. Neck: Supple, trachea midline, no cervical nor supraclavicular adenopathy. Pulmonary: Clear to auscultation bilaterally without wheezing, rhonchi, nor rales. Cardiac: Regular rate and rhythm.  No murmurs, rubs, nor gallops. No peripheral edema.  2+ peripheral pulses bilaterally. Abdomen: Bowel sounds normal.  No masses.  Non-tender without rebound.  Negative Murphy's sign. MSK: Grossly intact, no signs of weakness.  Full strength throughout upper and lower extremities.  Full ROM in upper and lower extremities.  No midline spinal tenderness. Neuro: Gait unremarkable, CN II-XII grossly intact.  C5-C6 Reflex 2/4 Bilaterally, L4 Reflex 2/4 Bilaterally.  Cerebellar function intact. Skin: No rashes. Psych: Alert and oriented to person/place/time.  Thought process normal. No anxiety/depression.  Assessment & Plan: Troy Oconnell was seen today for annual exam, chest pain and shortness of breath.  Diagnoses and all orders for this visit:  Annual physical exam -     Lipid panel -     CBC -     COMPLETE METABOLIC PANEL WITH GFR   Healthy lifestyle interventions including but not limited to regular exercise, a healthy low fat diet, moderation of salt intake, the  dangers of tobacco/alcohol/recreational drug use, nutrition supplementation, and accident avoidance were discussed with the patient and a handout was provided for future reference.  Return in about 1 year (around 03/26/2016).

## 2015-05-27 ENCOUNTER — Encounter: Payer: Self-pay | Admitting: Family Medicine

## 2015-05-27 DIAGNOSIS — R8290 Unspecified abnormal findings in urine: Secondary | ICD-10-CM

## 2015-06-10 LAB — URINALYSIS, ROUTINE W REFLEX MICROSCOPIC
BILIRUBIN URINE: NEGATIVE
GLUCOSE, UA: NEGATIVE
HGB URINE DIPSTICK: NEGATIVE
KETONES UR: NEGATIVE
Leukocytes, UA: NEGATIVE
NITRITE: NEGATIVE
PH: 6.5 (ref 5.0–8.0)
Protein, ur: NEGATIVE
SPECIFIC GRAVITY, URINE: 1.01 (ref 1.001–1.035)

## 2015-06-10 LAB — GC/CHLAMYDIA PROBE AMP
CT Probe RNA: NOT DETECTED
GC PROBE AMP APTIMA: NOT DETECTED

## 2015-06-11 LAB — URINE CULTURE
Colony Count: NO GROWTH
ORGANISM ID, BACTERIA: NO GROWTH

## 2015-09-23 ENCOUNTER — Encounter: Payer: Self-pay | Admitting: Family Medicine

## 2015-09-23 DIAGNOSIS — Z202 Contact with and (suspected) exposure to infections with a predominantly sexual mode of transmission: Secondary | ICD-10-CM

## 2015-09-28 ENCOUNTER — Other Ambulatory Visit: Payer: Self-pay | Admitting: Family Medicine

## 2015-09-29 LAB — GC/CHLAMYDIA PROBE AMP
CT PROBE, AMP APTIMA: NOT DETECTED
GC PROBE AMP APTIMA: NOT DETECTED

## 2015-09-29 LAB — HIV ANTIBODY (ROUTINE TESTING W REFLEX): HIV: NONREACTIVE

## 2015-09-29 LAB — RPR

## 2015-09-29 LAB — HEPATITIS C ANTIBODY: HCV Ab: NEGATIVE

## 2016-04-23 IMAGING — US US EXTREM  UP VENOUS*L*
1 series · 13 of 24 positions shown · non-contrast
Comparison: None.

CLINICAL DATA: Left forearm pain and bruising after giving blood
approximately 2 weeks ago. Evaluate for DVT.



[Series 1: us extrem up venous*left* · 0.06mm/px · 13 of 68 slices shown]
[im 1/68]
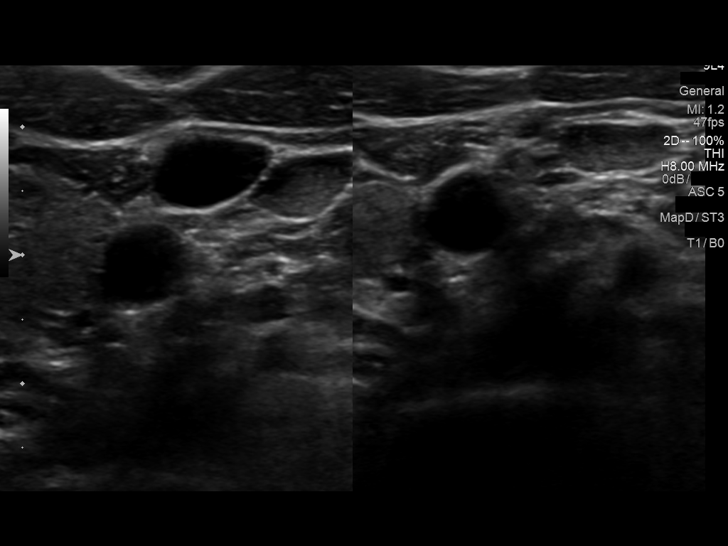
[im 6/68]
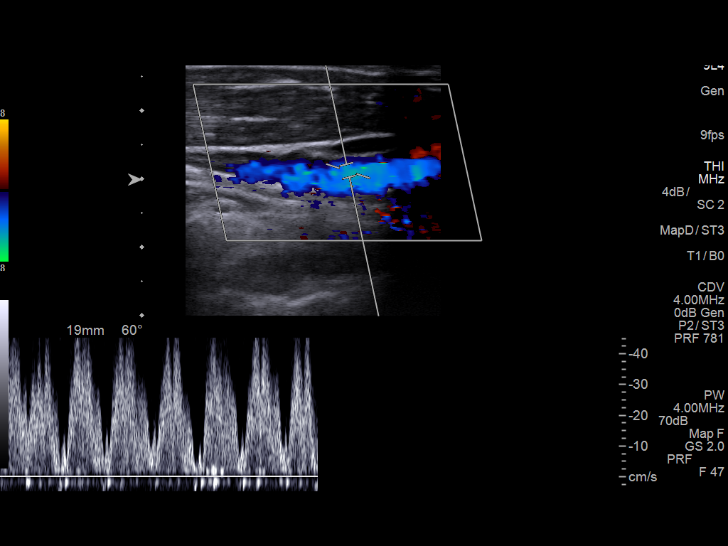
[im 12/68]
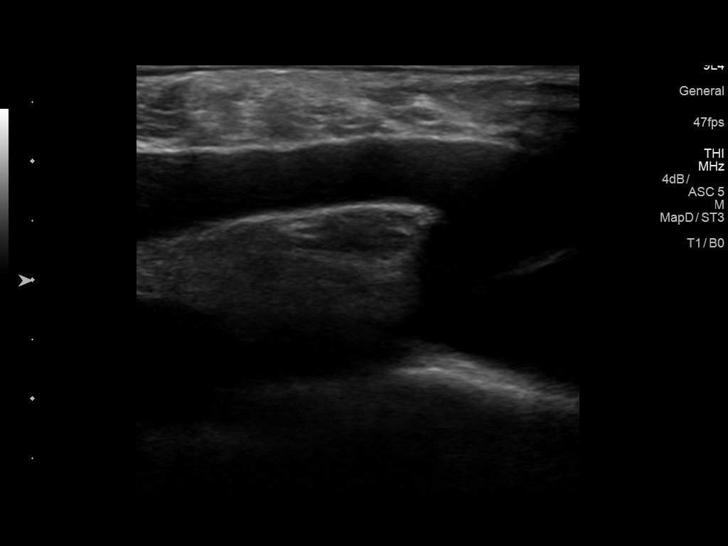
[im 18/68]
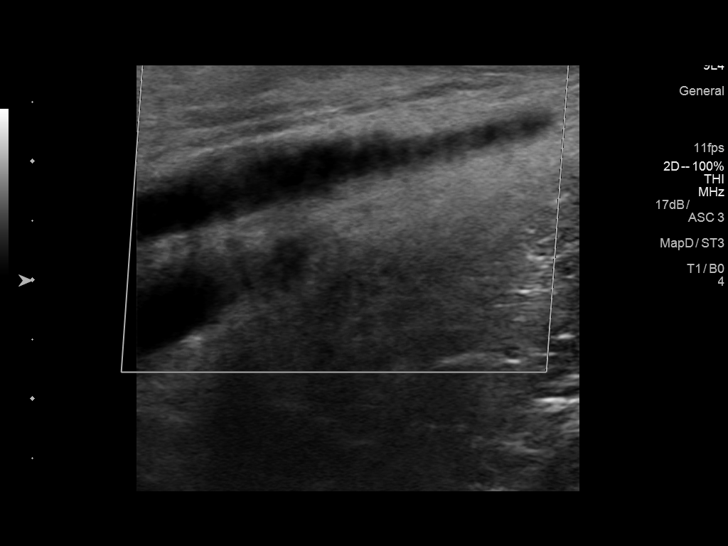
[im 24/68]
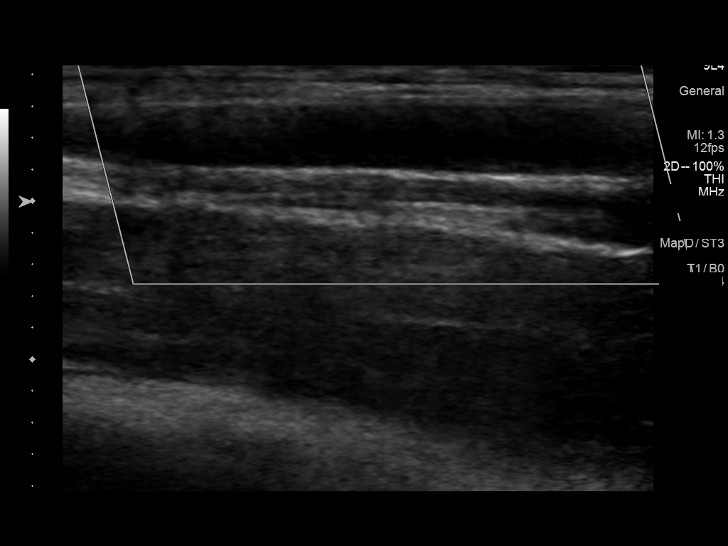
[im 30/68]
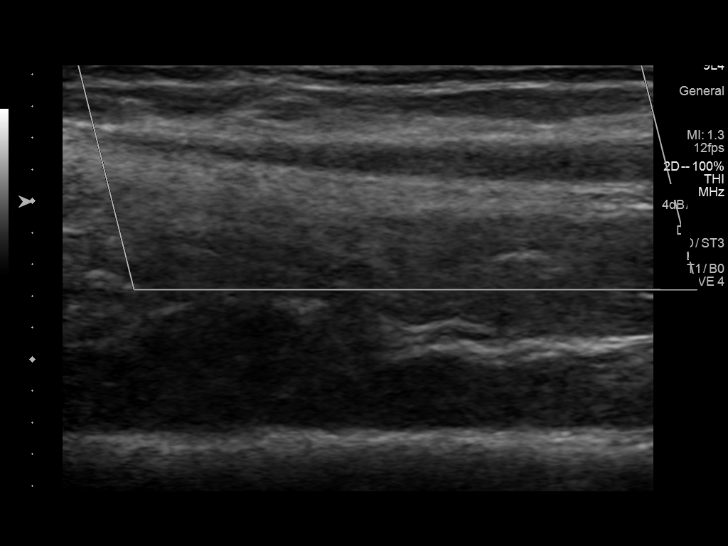
[im 35/68]
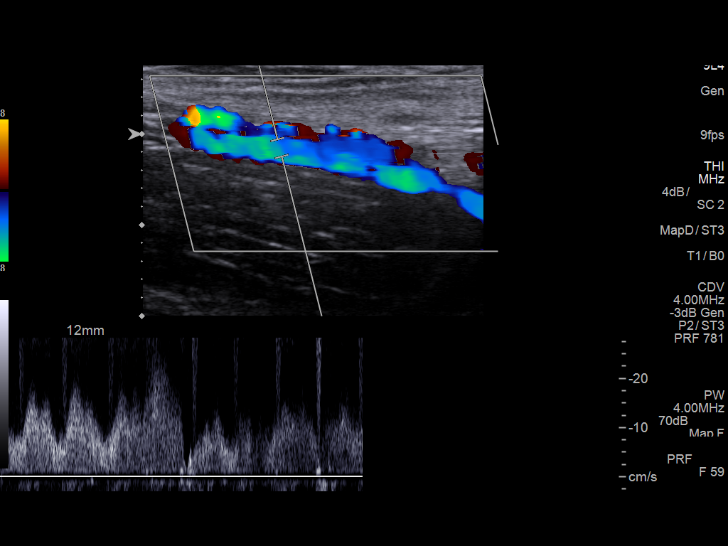
[im 38/68]
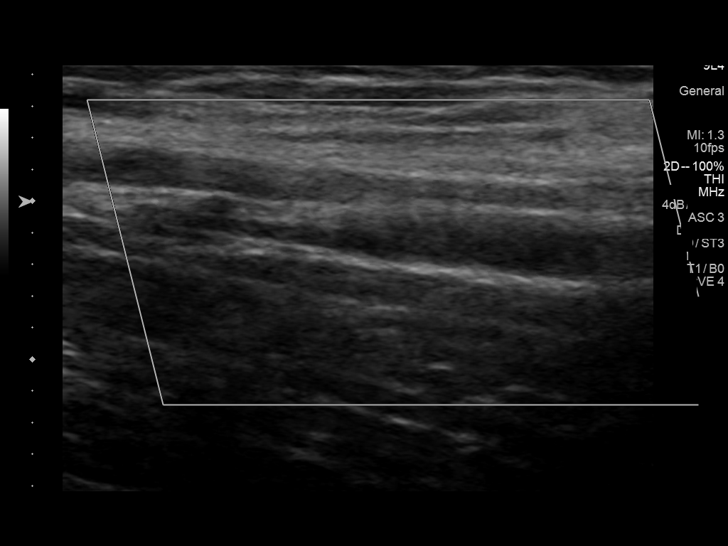
[im 44/68]
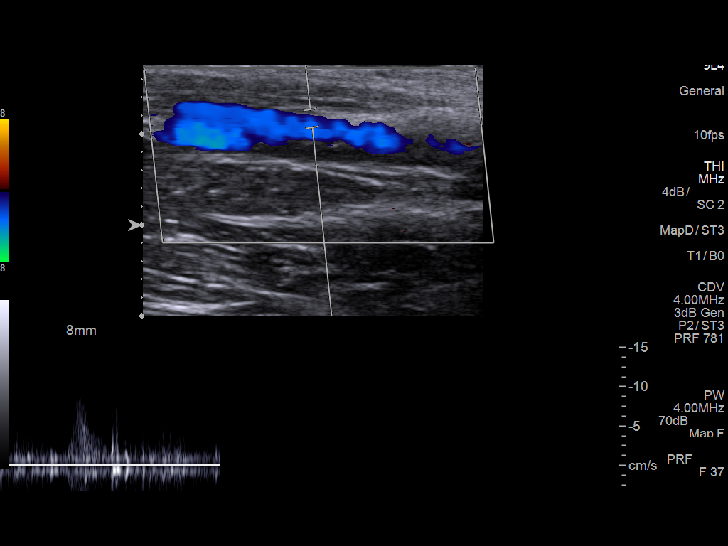
[im 50/68]
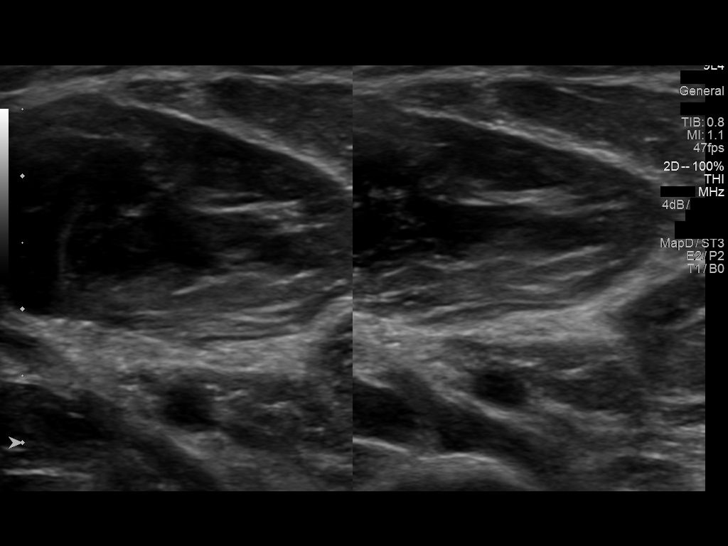
[im 56/68]
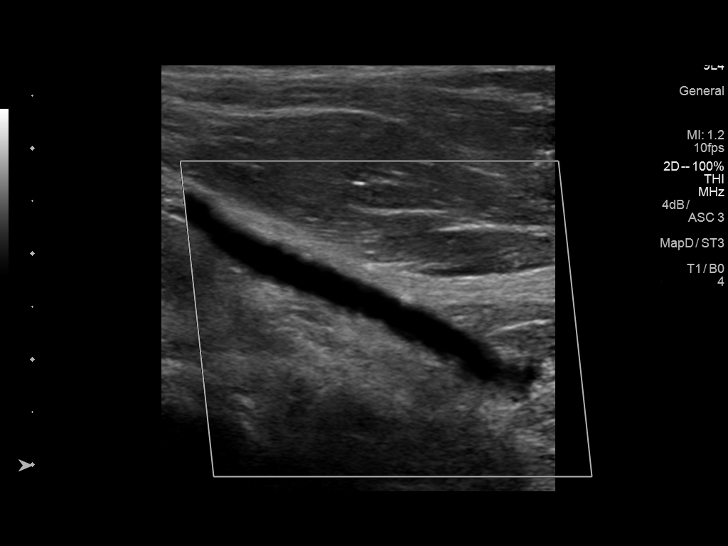
[im 62/68]
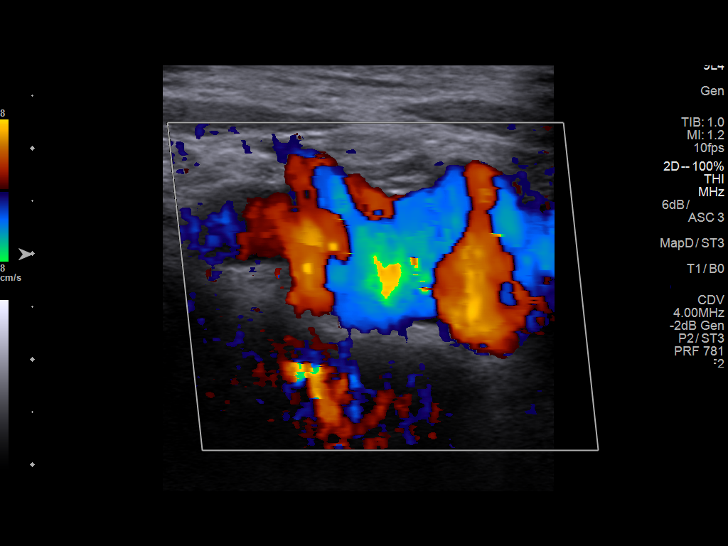
[im 68/68]
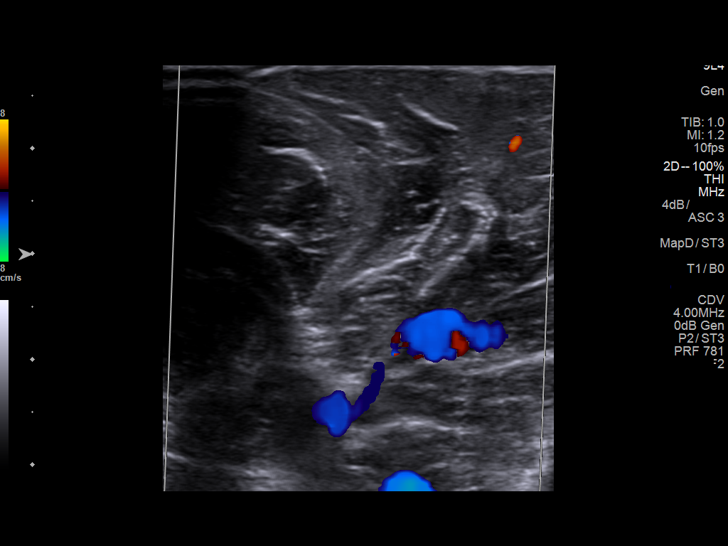

[13 of 24 positions shown; findings below may reference images not displayed]

FINDINGS: Contralateral Subclavian Vein: Respiratory phasicity is normal and
symmetric with the symptomatic side. No evidence of thrombus. Normal
compressibility.

Internal Jugular Vein: No evidence of thrombus. Normal
compressibility, respiratory phasicity and response to augmentation.

Subclavian Vein: No evidence of thrombus. Normal compressibility,
respiratory phasicity and response to augmentation.

Axillary Vein: No evidence of thrombus. Normal compressibility,
respiratory phasicity and response to augmentation.

Cephalic Vein: No evidence of thrombus. Normal compressibility,
respiratory phasicity and response to augmentation.

Basilic Vein: No evidence of thrombus. Normal compressibility,
respiratory phasicity and response to augmentation.

Brachial Veins: No evidence of thrombus. Normal compressibility,
respiratory phasicity and response to augmentation.

Radial Veins: No evidence of thrombus. Normal compressibility,
respiratory phasicity and response to augmentation.

Ulnar Veins: No evidence of thrombus. Normal compressibility,
respiratory phasicity and response to augmentation.

Venous Reflux:  None visualized.

Other Findings: Additional provided grayscale and color Doppler
images of the patient's palpable area of concern involving the
medial aspect of the elbow are negative for discrete solid or cystic
lesion (representative images 58 through 60).
IMPRESSION: No evidence of DVT within the left upper extremity.

## 2016-12-09 DIAGNOSIS — M9904 Segmental and somatic dysfunction of sacral region: Secondary | ICD-10-CM | POA: Diagnosis not present

## 2016-12-09 DIAGNOSIS — M545 Low back pain: Secondary | ICD-10-CM | POA: Diagnosis not present

## 2016-12-09 DIAGNOSIS — M9903 Segmental and somatic dysfunction of lumbar region: Secondary | ICD-10-CM | POA: Diagnosis not present

## 2016-12-30 DIAGNOSIS — M9904 Segmental and somatic dysfunction of sacral region: Secondary | ICD-10-CM | POA: Diagnosis not present

## 2016-12-30 DIAGNOSIS — M545 Low back pain: Secondary | ICD-10-CM | POA: Diagnosis not present

## 2016-12-30 DIAGNOSIS — M9903 Segmental and somatic dysfunction of lumbar region: Secondary | ICD-10-CM | POA: Diagnosis not present

## 2017-01-27 DIAGNOSIS — M9904 Segmental and somatic dysfunction of sacral region: Secondary | ICD-10-CM | POA: Diagnosis not present

## 2017-01-27 DIAGNOSIS — M545 Low back pain: Secondary | ICD-10-CM | POA: Diagnosis not present

## 2017-01-27 DIAGNOSIS — M9903 Segmental and somatic dysfunction of lumbar region: Secondary | ICD-10-CM | POA: Diagnosis not present

## 2017-03-03 DIAGNOSIS — M9904 Segmental and somatic dysfunction of sacral region: Secondary | ICD-10-CM | POA: Diagnosis not present

## 2017-03-03 DIAGNOSIS — M545 Low back pain: Secondary | ICD-10-CM | POA: Diagnosis not present

## 2017-03-03 DIAGNOSIS — M9903 Segmental and somatic dysfunction of lumbar region: Secondary | ICD-10-CM | POA: Diagnosis not present

## 2017-03-31 DIAGNOSIS — M9904 Segmental and somatic dysfunction of sacral region: Secondary | ICD-10-CM | POA: Diagnosis not present

## 2017-03-31 DIAGNOSIS — M9903 Segmental and somatic dysfunction of lumbar region: Secondary | ICD-10-CM | POA: Diagnosis not present

## 2017-03-31 DIAGNOSIS — M545 Low back pain: Secondary | ICD-10-CM | POA: Diagnosis not present

## 2017-04-21 DIAGNOSIS — M9903 Segmental and somatic dysfunction of lumbar region: Secondary | ICD-10-CM | POA: Diagnosis not present

## 2017-04-21 DIAGNOSIS — M9904 Segmental and somatic dysfunction of sacral region: Secondary | ICD-10-CM | POA: Diagnosis not present

## 2017-04-21 DIAGNOSIS — M545 Low back pain: Secondary | ICD-10-CM | POA: Diagnosis not present

## 2017-08-29 DIAGNOSIS — M9904 Segmental and somatic dysfunction of sacral region: Secondary | ICD-10-CM | POA: Diagnosis not present

## 2017-08-29 DIAGNOSIS — M9903 Segmental and somatic dysfunction of lumbar region: Secondary | ICD-10-CM | POA: Diagnosis not present

## 2017-08-29 DIAGNOSIS — M545 Low back pain: Secondary | ICD-10-CM | POA: Diagnosis not present

## 2017-11-20 ENCOUNTER — Emergency Department
Admission: EM | Admit: 2017-11-20 | Discharge: 2017-11-20 | Disposition: A | Discharge status: Home / Self Care | Payer: BLUE CROSS/BLUE SHIELD | Source: Home / Self Care | Attending: Family Medicine | Admitting: Family Medicine

## 2017-11-20 ENCOUNTER — Encounter: Payer: Self-pay | Admitting: Emergency Medicine

## 2017-11-20 ENCOUNTER — Emergency Department (INDEPENDENT_AMBULATORY_CARE_PROVIDER_SITE_OTHER): Payer: BLUE CROSS/BLUE SHIELD

## 2017-11-20 DIAGNOSIS — S20212A Contusion of left front wall of thorax, initial encounter: Secondary | ICD-10-CM

## 2017-11-20 DIAGNOSIS — S299XXA Unspecified injury of thorax, initial encounter: Secondary | ICD-10-CM | POA: Diagnosis not present

## 2017-11-20 DIAGNOSIS — R0781 Pleurodynia: Secondary | ICD-10-CM | POA: Diagnosis not present

## 2017-11-20 NOTE — ED Triage Notes (Signed)
Pt c/o left rib pain since 9/29. States he was wrestling with his sibling and landed on left rib area on the carpet. He has been using ice with no improvement.

## 2017-11-20 NOTE — Discharge Instructions (Addendum)
May apply heating pad 2 or 3 times daily.  May take ibuprofen 200mg , 4 tabs each evening if needed.

## 2017-11-20 NOTE — ED Provider Notes (Signed)
[name]    CSN: [id number] Arrival date & time: 11/20/17  1741     History   Chief Complaint Chief Complaint  Patient presents with  . Rib Injury    HPI Troy Oconnell is a 32 y.o. male.   8 days ago patient fell on his left side on a carpeted floor.  He had no significant pain immediately, but during the next 2 to 3 days his pain increased.  He denies shortness of breath.  The history is provided by the patient.  Chest Pain  Pain location:  L lateral chest Pain quality: aching   Pain radiates to:  Does not radiate Pain severity:  Mild Onset quality:  Gradual Duration:  8 days Timing:  Constant Progression:  Improving Chronicity:  New Context: breathing and movement   Relieved by:  Certain positions Worsened by:  Certain positions, coughing, deep breathing and movement Ineffective treatments: ice pack. Associated symptoms: no abdominal pain, no back pain, no cough, no diaphoresis, no fever, no palpitations, no shortness of breath and no syncope     Past Medical History:  Diagnosis Date  . Congenital lower limb vessel anomaly    Arteriovenous malformation of lower extremity    Patient Active Problem List   Diagnosis Date Noted  . Seborrheic keratoses 09/10/2013  . Open wound of knee, leg (except thigh), and ankle, complicated 06/05/2012  . Arteriovenous malformation of lower extremity 05/04/2012    Past Surgical History:  Procedure Laterality Date  . COLONOSCOPY         Home Medications    Prior to Admission medications   Not on File    Family History Family History  Problem Relation Age of Onset  . AVM Mother     Social History Social History   Tobacco Use  . Smoking status: Never Smoker  . Smokeless tobacco: Never Used  Substance Use Topics  . Alcohol use: No  . Drug use: No     Allergies   Patient has no known allergies.   Review of Systems Review of Systems  Constitutional: Negative for diaphoresis and fever.    Respiratory: Negative for cough and shortness of breath.   Cardiovascular: Positive for chest pain. Negative for palpitations and syncope.  Gastrointestinal: Negative for abdominal pain.  Musculoskeletal: Negative for back pain.  All other systems reviewed and are negative.    Physical Exam Triage Vital Signs ED Triage Vitals  Enc Vitals Group     BP 11/20/17 1836 125/75     Pulse Rate 11/20/17 1836 (!) 55     Resp --      Temp 11/20/17 1836 98 F (36.7 C)     Temp Source 11/20/17 1836 Oral     SpO2 11/20/17 1836 100 %     Weight 11/20/17 1838 165 lb (74.8 kg)     Height --      Head Circumference --      Peak Flow --      Pain Score 11/20/17 1837 6     Pain Loc --      Pain Edu? --      Excl. in GC? --    No data found.  Updated Vital Signs BP 125/75 (BP Location: Right Arm)   Pulse (!) 55   Temp 98 F (36.7 C) (Oral)   Wt 74.8 kg   SpO2 100%   BMI 23.68 kg/m   Visual Acuity Right Eye Distance:   Left Eye Distance:   Bilateral  Distance:    Right Eye Near:   Left Eye Near:    Bilateral Near:     Physical Exam  Constitutional: He appears well-developed and well-nourished. No distress.  HENT:  Head: Normocephalic.  Right Ear: External ear normal.  Left Ear: External ear normal.  Nose: Nose normal.  Mouth/Throat: Oropharynx is clear and moist.  Eyes: Pupils are equal, round, and reactive to light. Conjunctivae are normal.  Neck: Normal range of motion.  Cardiovascular: Normal heart sounds.  Pulmonary/Chest: Breath sounds normal.  Mild tenderness to palpation left anterior/lateral chest  as noted on diagram.  No ecchymosis or swelling.    Abdominal: There is no tenderness.  Musculoskeletal: He exhibits no edema.  Neurological: He is alert.  Skin: Skin is warm and dry. No rash noted.  Nursing note and vitals reviewed.    UC Treatments / Results  Labs (all labs ordered are listed, but only abnormal results are displayed) Labs Reviewed - No data  to display  EKG None  Radiology Dg Ribs Unilateral W/chest Left  Result Date: 11/20/2017 CLINICAL DATA:  Pt with Lt rib pain for over 1 week after falling onto his left side. Pain with a deep breath. Non-smoker. EXAM: LEFT RIBS AND CHEST - 3+ VIEW COMPARISON:  None. FINDINGS: No fracture or other bone lesions are seen involving the ribs. There is no evidence of pneumothorax or pleural effusion. Both lungs are clear. Heart size and mediastinal contours are within normal limits. IMPRESSION: Negative. Electronically Signed   By: Amie Portland M.D.   On: 11/20/2017 18:55    Procedures Procedures (including critical care time)  Medications Ordered in UC Medications - No data to display  Initial Impression / Assessment and Plan / UC Course  I have reviewed the triage vital signs and the nursing notes.  Pertinent labs & imaging results that were available during my care of the patient were reviewed by me and considered in my medical decision making (see chart for details).    Negative chest X-ray reassuring.   Final Clinical Impressions(s) / UC Diagnoses   Final diagnoses:  Contusion of left chest wall, initial encounter     Discharge Instructions     May apply heating pad 2 or 3 times daily.  May take ibuprofen 200mg , 4 tabs each evening if needed.    ED Prescriptions    None        Lattie Haw, MD 11/23/17 [id number]

## 2018-06-24 ENCOUNTER — Emergency Department (INDEPENDENT_AMBULATORY_CARE_PROVIDER_SITE_OTHER)
Admission: EM | Admit: 2018-06-24 | Discharge: 2018-06-24 | Disposition: A | Discharge status: Home / Self Care | Payer: BLUE CROSS/BLUE SHIELD | Source: Home / Self Care | Attending: Family Medicine | Admitting: Family Medicine

## 2018-06-24 ENCOUNTER — Other Ambulatory Visit: Payer: Self-pay

## 2018-06-24 ENCOUNTER — Encounter: Payer: Self-pay | Admitting: Emergency Medicine

## 2018-06-24 DIAGNOSIS — R5383 Other fatigue: Secondary | ICD-10-CM

## 2018-06-24 DIAGNOSIS — R001 Bradycardia, unspecified: Secondary | ICD-10-CM

## 2018-06-24 DIAGNOSIS — D72819 Decreased white blood cell count, unspecified: Secondary | ICD-10-CM

## 2018-06-24 LAB — COMPLETE METABOLIC PANEL WITH GFR
AG Ratio: 1.5 (calc) (ref 1.0–2.5)
ALT: 21 U/L (ref 9–46)
AST: 19 U/L (ref 10–40)
Albumin: 4.8 g/dL (ref 3.6–5.1)
Alkaline phosphatase (APISO): 52 U/L (ref 36–130)
BUN: 16 mg/dL (ref 7–25)
CO2: 27 mmol/L (ref 20–32)
Calcium: 10.1 mg/dL (ref 8.6–10.3)
Chloride: 102 mmol/L (ref 98–110)
Creat: 1.01 mg/dL (ref 0.60–1.35)
GFR, Est African American: 115 mL/min/{1.73_m2} (ref >=60)
GFR, Est Non African American: 99 mL/min/{1.73_m2} (ref >=60)
Globulin: 3.2 g/dL (calc) (ref 1.9–3.7)
Glucose, Bld: 103 mg/dL — ABNORMAL HIGH (ref 65–99)
Potassium: 4.2 mmol/L (ref 3.5–5.3)
Sodium: 137 mmol/L (ref 135–146)
Total Bilirubin: 0.9 mg/dL (ref 0.2–1.2)
Total Protein: 8 g/dL (ref 6.1–8.1)

## 2018-06-24 LAB — POCT CBC W AUTO DIFF (K'VILLE URGENT CARE)

## 2018-06-24 LAB — TSH: TSH: 2.62 mIU/L (ref 0.40–4.50)

## 2018-06-24 NOTE — Discharge Instructions (Addendum)
Limit athletic activity for now.  Remain well hydrated.  If symptoms become significantly worse during the night or over the weekend, proceed to the local emergency room.

## 2018-06-24 NOTE — ED Triage Notes (Signed)
Patient has been fasting for Ramadan since 06/07/18; feeling fatigued and notices his apple watch registering pulse below 40 at times when resting; very active.

## 2018-06-24 NOTE — ED Provider Notes (Signed)
[facility]    CSN: [id number] Arrival date & time: 06/24/18  1106     History   Chief Complaint Chief Complaint  Patient presents with  . Fatigue    fasting for Ramadan  . Bradycardia    HPI Troy Oconnell is a 33 y.o. male.   Patient reports that he has been fasting for Ramadan since 06/07/18.  Recently he has become increasingly fatigued and has noticed his Apple watch registering a heart rate at night between 35 - 40.  He exercises regularly and normally has a low resting heart rate (but always above 50 to 60).  He states that he has continued regular exercise while fasting.  He denies chest pain or shortness of breath, no prolonged light-headedness, and no syncope.  He denies history of OSA or hypothyroid.     Past Medical History:  Diagnosis Date  . Congenital lower limb vessel anomaly    Arteriovenous malformation of lower extremity    Patient Active Problem List   Diagnosis Date Noted  . Seborrheic keratoses 09/10/2013  . Open wound of knee, leg (except thigh), and ankle, complicated 06/05/2012  . Arteriovenous malformation of lower extremity 05/04/2012    Past Surgical History:  Procedure Laterality Date  . COLONOSCOPY         Home Medications    Prior to Admission medications   None    Family History Family History  Problem Relation Age of Onset  . AVM Mother     Social History Social History   Tobacco Use  . Smoking status: Never Smoker  . Smokeless tobacco: Never Used  Substance Use Topics  . Alcohol use: No  . Drug use: No     Allergies   Patient has no known allergies.   Review of Systems Review of Systems  Constitutional: Positive for fatigue. Negative for activity change, appetite change, chills, diaphoresis, fever and unexpected weight change.  HENT: Negative.   Eyes: Negative.   Respiratory: Negative.   Cardiovascular:       Bradycardia  Gastrointestinal: Negative.   Genitourinary: Negative.    Musculoskeletal: Negative.   Skin: Negative.   Neurological: Positive for light-headedness. Negative for dizziness, tremors, seizures, syncope, facial asymmetry, speech difficulty, weakness, numbness and headaches.  Hematological: Negative.      Physical Exam Triage Vital Signs ED Triage Vitals  Enc Vitals Group     BP 06/24/18 1132 112/76     Pulse Rate 06/24/18 1132 78     Resp 06/24/18 1132 16     Temp 06/24/18 1132 97.6 F (36.4 C)     Temp Source 06/24/18 1132 Oral     SpO2 06/24/18 1132 100 %     Weight 06/24/18 1133 155 lb (70.3 kg)     Height 06/24/18 1133 5\' 11"  (1.803 m)     Head Circumference --      Peak Flow --      Pain Score 06/24/18 1133 0     Pain Loc --      Pain Edu? --      Excl. in GC? --    Orthostatic VS for the past 24 hrs:  BP- Lying Pulse- Lying BP- Sitting Pulse- Sitting  06/24/18 1215 118/84 51 122/77 71    Updated Vital Signs BP 112/76 (BP Location: Right Arm)   Pulse 78   Temp 97.6 F (36.4 C) (Oral)   Resp 16   Ht 5\' 11"  (1.803 m)   Wt 70.3 kg  SpO2 100%   BMI 21.62 kg/m   Visual Acuity Right Eye Distance:   Left Eye Distance:   Bilateral Distance:    Right Eye Near:   Left Eye Near:    Bilateral Near:     Physical Exam Nursing notes and Vital Signs reviewed. Appearance:  Patient appears stated age, and in no acute distress Eyes:  Pupils are equal, round, and reactive to light and accomodation.  Extraocular movement is intact.  Conjunctivae are not inflamed  Ears:  Canals normal.  Tympanic membranes normal.  Nose:  Normal turbinates.  No sinus tenderness.   Pharynx:  Normal; moist mucous membranes  Neck:  Supple.  No adenopathy or thyromegaly.  Lungs:  Clear to auscultation.  Breath sounds are equal.  Moving air well. Heart:  Regular rate and rhythm without murmurs, rubs, or gallops.  Abdomen:  Nontender without masses or hepatosplenomegaly.  Bowel sounds are present.  No CVA or flank tenderness.  Extremities:  No edema.   Skin:  No rash present.    UC Treatments / Results  Labs (all labs ordered are listed, but only abnormal results are displayed) Labs Reviewed  COMPLETE METABOLIC PANEL WITH GFR  TSH  POCT CBC W AUTO DIFF ([facility]):  WBC 2.7; LY 64.4; MO 6.2; GR 29.4; Hgb 14.5; Platelets 178     EKG  Rate:  63 BPM PR:  142 msec QT:  398 msec QTcH:  407 msec QRSD:  98 msec QRS axis:  68 degrees Interpretation:  No acute changes; normal sinus rhythm.  Review of records reveals no significant change from EKG done 02 June 2011.  Radiology No results found.  Procedures Procedures (including critical care time)  Medications Ordered in UC Medications - No data to display  Initial Impression / Assessment and Plan / UC Course  I have reviewed the triage vital signs and the nursing notes.  Pertinent labs & imaging results that were available during my care of the patient were reviewed by me and considered in my medical decision making (see chart for details).    Suspect fasting related metabolic dysfunction exacerbated by excessive athletic activity. Negative EKG reassuring Note leukopenia (WBC 2.7).  Previous WBC 4.1 on 03/27/15. Will check CMP and TSH. Recommend followup with Family Doctor in about 3 weeks to repeat CBC after patient has resumed normal dietary intake.   Final Clinical Impressions(s) / UC Diagnoses   Final diagnoses:  Bradycardia  Fatigue, unspecified type  Leukopenia, unspecified type     Discharge Instructions     Limit athletic activity for now.  Remain well hydrated.  If symptoms become significantly worse during the night or over the weekend, proceed to the local emergency room.     ED Prescriptions    None        Lattie HawBeese, Deslyn Cavenaugh A, MD 06/24/18 2052

## 2018-06-25 ENCOUNTER — Telehealth: Payer: Self-pay

## 2018-06-25 NOTE — Telephone Encounter (Signed)
Spoke with patient, feeling better.  Labs normal.  Will follow up as needed.

## 2018-10-19 ENCOUNTER — Other Ambulatory Visit: Payer: Self-pay

## 2018-10-19 ENCOUNTER — Encounter: Payer: Self-pay | Admitting: Osteopathic Medicine

## 2018-10-19 ENCOUNTER — Ambulatory Visit (INDEPENDENT_AMBULATORY_CARE_PROVIDER_SITE_OTHER): Payer: BC Managed Care – PPO | Admitting: Osteopathic Medicine

## 2018-10-19 VITALS — BP 109/47 | HR 54 | Temp 98.1°F | Ht 71.0 in | Wt 160.3 lb

## 2018-10-19 DIAGNOSIS — Z0000 Encounter for general adult medical examination without abnormal findings: Secondary | ICD-10-CM

## 2018-10-19 NOTE — Progress Notes (Signed)
HPI: Troy Oconnell is a 33 y.o. male who  has a past medical history of Congenital lower limb vessel anomaly.  he presents to [facility] today, 10/19/18,  for chief complaint of: New to establish care     Patient here for annual physical / wellness exam.  See preventive care reviewed as below.  Recent labs reviewed in detail with the patient.   Additional concerns today include:  None Some muscle ache from helping his sister move, he's not too worried about it.  Traveled to [place] last year, had a bunch of vaccines, has records at home     Past medical, surgical, social and family history reviewed:  Patient Active Problem List   Diagnosis Date Noted  . Seborrheic keratoses 09/10/2013  . Open wound of knee, leg (except thigh), and ankle, complicated 06/05/2012  . Arteriovenous malformation of lower extremity 05/04/2012    Past Surgical History:  Procedure Laterality Date  . COLONOSCOPY      Social History   Tobacco Use  . Smoking status: Never Smoker  . Smokeless tobacco: Never Used  Substance Use Topics  . Alcohol use: No    Family History  Problem Relation Age of Onset  . AVM Mother   . High blood pressure Paternal Grandmother   . High blood pressure Paternal Grandfather      Current medication list and allergy/intolerance information reviewed:    No current outpatient medications on file.   No current facility-administered medications for this visit.     No Known Allergies    Review of Systems:  Constitutional:  No  fever, no chills, No recent illness, No unintentional weight changes. No significant fatigue.   HEENT: No  headache, no vision change, no hearing change, No sore throat, No  sinus pressure  Cardiac: No  chest pain, No  pressure, No palpitations, No  Orthopnea  Respiratory:  No  shortness of breath. No  Cough  Gastrointestinal: No  abdominal pain, No  nausea, No  vomiting,  No  blood in stool, No   diarrhea, No  constipation   Musculoskeletal: No new myalgia/arthralgia  Skin: No  Rash, No other wounds/concerning lesions  Genitourinary: No  incontinence, No  abnormal genital bleeding, No abnormal genital discharge  Hem/Onc: No  easy bruising/bleeding, No  abnormal lymph node  Endocrine: No cold intolerance,  No heat intolerance. No polyuria/polydipsia/polyphagia   Neurologic: No  weakness, No  dizziness, No  slurred speech/focal weakness/facial droop  Psychiatric: No  concerns with depression, No  concerns with anxiety, No sleep problems, No mood problems  Exam:  BP (!) 109/47 (BP Location: Left Arm, Patient Position: Sitting, Cuff Size: Normal)   Pulse (!) 54   Temp 98.1 F (36.7 C) (Oral)   Ht 5\' 11"  (1.803 m)   Wt 160 lb 4.8 oz (72.7 kg)   BMI 22.36 kg/m   Constitutional: VS see above. General Appearance: alert, well-developed, well-nourished, NAD  Eyes: Normal lids and conjunctive, non-icteric sclera  Ears, Nose, Mouth, Throat:  TM normal bilaterally.   Neck: No masses, trachea midline. No thyroid enlargement. No tenderness/mass appreciated. No lymphadenopathy  Respiratory: Normal respiratory effort. no wheeze, no rhonchi, no rales  Cardiovascular: S1/S2 normal, no murmur, no rub/gallop auscultated. RRR. No lower extremity edema.  Gastrointestinal: Nontender, no masses. No hepatomegaly, no splenomegaly. No hernia appreciated. Bowel sounds normal. Rectal exam deferred.   Musculoskeletal: Gait normal. No clubbing/cyanosis of digits.   Neurological: Normal balance/coordination. No tremor. No cranial  nerve deficit on limited exam. Motor and sensation intact and symmetric. Cerebellar reflexes intact.   Skin: warm, dry, intact. No rash/ulcer. No concerning nevi or subq nodules on limited exam.    Psychiatric: Normal judgment/insight. Normal mood and affect. Oriented x3.      No results found for this or any previous visit (from the past 72 hour(s)).  No results  found.   ASSESSMENT/PLAN: The encounter diagnosis was Annual physical exam.  Orders Placed This Encounter  Procedures  . CBC  . COMPLETE METABOLIC PANEL WITH GFR  . Lipid panel     Patient Instructions  General Preventive Care  Most recent routine screening lipids/other labs: ordered today .   Everyone should have blood pressure checked once per year.   Tobacco: don't!   Alcohol: responsible moderation is ok for most adults - if you have concerns about your alcohol intake, please talk to me!   Exercise: as tolerated to reduce risk of cardiovascular disease and diabetes. Strength training will also prevent osteoporosis.   Mental health: if need for mental health care (medicines, counseling, other), or concerns about moods, please let me know!   Sexual health: if need for STD testing, or if concerns with libido/pain problems, please let me know!   Advanced Directive: Living Will and/or Healthcare Power of Attorney recommended for all adults, regardless of age or health.  Vaccines  Flu vaccine: recommended for almost everyone, every fall.   Shingles vaccine: Shingrix recommended after age 67.   Pneumonia vaccines: Prevnar and Pneumovax recommended after age 37, or sooner if certain medical conditions.  Tetanus booster: Tdap recommended every 10 years.   HPV vaccine: Gardasil recommended up to age 51 to prevent HPV-associated diseases, including certain cancers.  Cancer screenings   Colon cancer screening: recommended for everyone at age 13, but some folks need a colonoscopy sooner if risk factors   Prostate cancer screening: PSA blood test around age 47  Lung cancer screening: not needed for non-smokers. Infection screenings . HIV, Gonorrhea/Chlamydia: screening as needed. . TB: certain at-risk populations, or depending on work requirements and/or travel history Other . Bone Density Test: recommended for men at age 76 . Abdominal Aortic Aneurysm: screening with  ultrasound recommended once for men age 69-75 who have ever smoked         Visit summary with medication list and pertinent instructions was printed for patient to review. All questions at time of visit were answered - patient instructed to contact office with any additional concerns or updates. ER/RTC precautions were reviewed with the patient.    Please note: voice recognition software was used to produce this document, and typos may escape review. Please contact Dr. Sheppard Coil for any needed clarifications.     Follow-up plan: Return in about 1 year (around 10/19/2019) for [place] (call week prior to visit for lab orders).

## 2018-10-19 NOTE — Patient Instructions (Addendum)
General Preventive Care  Most recent routine screening lipids/other labs: ordered today .   Everyone should have blood pressure checked once per year.   Tobacco: don't!   Alcohol: responsible moderation is ok for most adults - if you have concerns about your alcohol intake, please talk to me!   Exercise: as tolerated to reduce risk of cardiovascular disease and diabetes. Strength training will also prevent osteoporosis.   Mental health: if need for mental health care (medicines, counseling, other), or concerns about moods, please let me know!   Sexual health: if need for STD testing, or if concerns with libido/pain problems, please let me know!   Advanced Directive: Living Will and/or Healthcare Power of Attorney recommended for all adults, regardless of age or health.  Vaccines  Flu vaccine: recommended for almost everyone, every fall.   Shingles vaccine: Shingrix recommended after age 67.   Pneumonia vaccines: Prevnar and Pneumovax recommended after age 72, or sooner if certain medical conditions.  Tetanus booster: Tdap recommended every 10 years.   HPV vaccine: Gardasil recommended up to age 42 to prevent HPV-associated diseases, including certain cancers.  Cancer screenings   Colon cancer screening: recommended for everyone at age 71, but some folks need a colonoscopy sooner if risk factors   Prostate cancer screening: PSA blood test around age 52  Lung cancer screening: not needed for non-smokers. Infection screenings . HIV, Gonorrhea/Chlamydia: screening as needed. . TB: certain at-risk populations, or depending on work requirements and/or travel history Other . Bone Density Test: recommended for men at age 85 . Abdominal Aortic Aneurysm: screening with ultrasound recommended once for men age 74-75 who have ever smoked

## 2018-10-20 LAB — CBC
HCT: 42.1 % (ref 38.5–50.0)
Hemoglobin: 13.7 g/dL (ref 13.2–17.1)
MCH: 29.8 pg (ref 27.0–33.0)
MCHC: 32.5 g/dL (ref 32.0–36.0)
MCV: 91.5 fL (ref 80.0–100.0)
MPV: 12 fL (ref 7.5–12.5)
Platelets: 197 10*3/uL (ref 140–400)
RBC: 4.6 10*6/uL (ref 4.20–5.80)
RDW: 11.9 % (ref 11.0–15.0)
WBC: 3.7 10*3/uL — ABNORMAL LOW (ref 3.8–10.8)

## 2018-10-20 LAB — COMPLETE METABOLIC PANEL WITH GFR
AG Ratio: 1.5 (calc) (ref 1.0–2.5)
ALT: 37 U/L (ref 9–46)
AST: 26 U/L (ref 10–40)
Albumin: 4.6 g/dL (ref 3.6–5.1)
Alkaline phosphatase (APISO): 50 U/L (ref 36–130)
BUN: 19 mg/dL (ref 7–25)
CO2: 27 mmol/L (ref 20–32)
Calcium: 9.9 mg/dL (ref 8.6–10.3)
Chloride: 104 mmol/L (ref 98–110)
Creat: 0.92 mg/dL (ref 0.60–1.35)
GFR, Est African American: 129 mL/min/{1.73_m2} (ref >=60)
GFR, Est Non African American: 111 mL/min/{1.73_m2} (ref >=60)
Globulin: 3 g/dL (calc) (ref 1.9–3.7)
Glucose, Bld: 94 mg/dL (ref 65–99)
Potassium: 4.5 mmol/L (ref 3.5–5.3)
Sodium: 138 mmol/L (ref 135–146)
Total Bilirubin: 0.8 mg/dL (ref 0.2–1.2)
Total Protein: 7.6 g/dL (ref 6.1–8.1)

## 2018-10-20 LAB — LIPID PANEL
Cholesterol: 167 mg/dL (ref <200)
HDL: 71 mg/dL (ref >=40)
LDL Cholesterol (Calc): 88 mg/dL (calc)
Non-HDL Cholesterol (Calc): 96 mg/dL (calc) (ref <130)
Total CHOL/HDL Ratio: 2.4 (calc) (ref <5.0)
Triglycerides: 28 mg/dL (ref <150)

## 2018-10-31 ENCOUNTER — Encounter: Payer: Self-pay | Admitting: Osteopathic Medicine

## 2018-11-01 ENCOUNTER — Ambulatory Visit (INDEPENDENT_AMBULATORY_CARE_PROVIDER_SITE_OTHER): Payer: BC Managed Care – PPO | Admitting: Family Medicine

## 2018-11-01 ENCOUNTER — Other Ambulatory Visit: Payer: Self-pay

## 2018-11-01 ENCOUNTER — Encounter: Payer: Self-pay | Admitting: Family Medicine

## 2018-11-01 ENCOUNTER — Ambulatory Visit (INDEPENDENT_AMBULATORY_CARE_PROVIDER_SITE_OTHER): Payer: BC Managed Care – PPO

## 2018-11-01 VITALS — BP 116/64 | HR 74 | Temp 97.7°F | Wt 157.0 lb

## 2018-11-01 DIAGNOSIS — S39012A Strain of muscle, fascia and tendon of lower back, initial encounter: Secondary | ICD-10-CM

## 2018-11-01 DIAGNOSIS — M545 Low back pain: Secondary | ICD-10-CM | POA: Diagnosis not present

## 2018-11-01 MED ORDER — CYCLOBENZAPRINE HCL 10 MG PO TABS: 10.0000 mg | ORAL_TABLET | Freq: Three times a day (TID) | ORAL | 1 refills | Status: DC | PRN

## 2018-11-01 NOTE — Progress Notes (Signed)
Delrick Dehart is a 33 y.o. male who presents to [facility] today for low back pain.  Patient has had low back pain for about 6 weeks.  He helped his sister move in late July and developed pain shortly after.  Pain is located in the bilateral lower back extending sometimes to the lateral hips on both sides.  He denies any pain radiating down his legs weakness or numbness distally.  His pain is been worsening somewhat over the last few weeks.  He is tried a lot of home exercises and treatment.  He is tried ibuprofen Aleve and Tylenol.  He is also tried heat IcyHot stretching and foam rollers.  His pain is worse with activity and better with rest.  No fevers chills nausea vomiting or diarrhea.  He denies any acute injury.  He denies history of similar episodes in the past.  He is physically active and does lots of exercises.  He has had to stop lifting weights and doing his typical home calisthenics exercises because of his back pain.  Additionally he notes pain at work limiting his ability to sit or stand for prolonged period of time.    ROS:  As above  Exam:  BP 116/64   Pulse 74   Temp 97.7 F (36.5 C) (Oral)   Wt 157 lb (71.2 kg)   SpO2 100%   BMI 21.90 kg/m  Wt Readings from Last 5 Encounters:  11/01/18 157 lb (71.2 kg)  10/19/18 160 lb 4.8 oz (72.7 kg)  06/24/18 155 lb (70.3 kg)  11/20/17 165 lb (74.8 kg)  03/27/15 178 lb (80.7 kg)   General: Well Developed, well nourished, and in no acute distress.  Neuro/Psych: Alert and oriented x3, extra-ocular muscles intact, able to move all 4 extremities, sensation grossly intact. Skin: Warm and dry, no rashes noted.  Respiratory: Not using accessory muscles, speaking in full sentences, trachea midline.  Cardiovascular: Pulses palpable, no extremity edema. Abdomen: Does not appear distended. MSK: L-spine: Nontender to spinal midline.  Nontender paraspinal musculature. Lumbar range of motion:  Normal extension rotation and lateral flexion.  Limited flexion. Lower extremity strength reflexes and sensation are equal normal throughout bilateral extremities. Patient has poor voluntary control of paraspinal musculature. Hips bilaterally normal-appearing nontender normal strength. Normal gait.    Lab and Radiology Results X-ray images L-spine obtained today personally independently reviewed. No acute fractures or severe degenerative changes or malalignment.  Relatively normal-appearing lumbar x-ray. Await formal radiology review    Assessment and Plan: 33 y.o. male with low back pain.  Likely myofascial strain however pain is been ongoing now for about 6 weeks.  Plan to proceed with trial of physical therapy.  Also will use continued NSAIDs at muscle relaxers.  Continue heating pad TENS unit.  Recheck 4 to 6 weeks if not better.   PDMP not reviewed this encounter. Orders Placed This Encounter  Procedures  . DG Lumbar Spine Complete    Standing Status:   Future    Number of Occurrences:   1    Standing Expiration Date:   01/01/2020    Order Specific Question:   Reason for Exam (SYMPTOM  OR DIAGNOSIS REQUIRED)    Answer:   eval LBP x 6 weeks.    Order Specific Question:   Preferred imaging location?    Answer:   [facility]    Order Specific Question:   Radiology Contrast Protocol - do NOT remove file path  Answer:   [email or web address]  . Ambulatory referral to Physical Therapy    Referral Priority:   Routine    Referral Type:   Physical Medicine    Referral Reason:   Specialty Services Required    Requested Specialty:   Physical Therapy   Meds ordered this encounter  Medications  . cyclobenzaprine (FLEXERIL) 10 MG tablet    Sig: Take 1 tablet (10 mg total) by mouth 3 (three) times daily as needed for muscle spasms.    Dispense:  30 tablet    Refill:  1    Historical information moved to improve visibility of  documentation.  Past Medical History:  Diagnosis Date  . Congenital lower limb vessel anomaly    Arteriovenous malformation of lower extremity   Past Surgical History:  Procedure Laterality Date  . COLONOSCOPY     Social History   Tobacco Use  . Smoking status: Never Smoker  . Smokeless tobacco: Never Used  Substance Use Topics  . Alcohol use: No   family history includes AVM in his mother; High blood pressure in his paternal grandfather and paternal grandmother.  Medications: Current Outpatient Medications  Medication Sig Dispense Refill  . cyclobenzaprine (FLEXERIL) 10 MG tablet Take 1 tablet (10 mg total) by mouth 3 (three) times daily as needed for muscle spasms. 30 tablet 1   No current facility-administered medications for this visit.    No Known Allergies    Discussed warning signs or symptoms. Please see discharge instructions. Patient expresses understanding.

## 2018-11-01 NOTE — Patient Instructions (Signed)
Thank you for coming in today. Attend PT.  Work on [employer]ange of motion.  Heating pad and TENS unit.  Take iburpofen or aleve for pain as needed.  Ok to use cylobenzaprine for muscle spasm or cramp as needed.  Get xray now.  Recheck if not improving in 4-6 weeks   Come back or go to the emergency room if you notice new weakness new numbness problems walking or bowel or bladder problems.   Lumbosacral Strain Lumbosacral strain is an injury that causes pain in the lower back (lumbosacral spine). This injury usually occurs from overstretching the muscles or ligaments along your spine. A strain can affect one or more muscles or cord-like tissues that connect bones to other bones (ligaments). What are the causes? This condition may be caused by:  A hard, direct hit (blow) to the back.  Excessive stretching of the lower back muscles. This may result from: ? A fall. ? Lifting something heavy. ? Repetitive movements such as bending or crouching. What increases the risk? The following factors may increase your risk of getting this condition:  Participating in sports or activities that involve: ? A sudden twist of the back. ? Pushing or pulling motions.  Being overweight or obese.  Having poor strength and flexibility, especially tight hamstrings or weak muscles in the back or abdomen.  Having too much of a curve in the lower back.  Having a pelvis that is tilted forward. What are the signs or symptoms? The main symptom of this condition is pain in the lower back, at the site of the strain. Pain may extend (radiate) down one or both legs. How is this diagnosed? This condition is diagnosed based on:  Your symptoms.  Your medical history.  A physical exam. ? Your health care provider may push on certain areas of your back to determine the source of your pain. ? You may be asked to bend forward, backward, and side to side to assess the severity of your pain and your range of motion.   Imaging tests, such as: ? X-rays. ? MRI.  How is this treated? Treatment for this condition may include:  Putting heat and cold on the affected area.  Medicines to help relieve pain and relax your muscles (muscle relaxants).  NSAIDs to help reduce swelling and discomfort. When your symptoms improve, it is important to gradually return to your normal routine as soon as possible to reduce pain, avoid stiffness, and avoid loss of muscle strength. Generally, symptoms should improve within 6 weeks of treatment. However, recovery time varies. Follow these instructions at home: Managing pain, stiffness, and swelling   If directed, put ice on the injured area during the first 24 hours after your strain. ? Put ice in a plastic bag. ? Place a towel between your skin and the bag. ? Leave the ice on for 20 minutes, 2-3 times a day.  If directed, put heat on the affected area as often as told by your health care provider. Use the heat source that your health care provider recommends, such as a moist heat pack or a heating pad. ? Place a towel between your skin and the heat source. ? Leave the heat on for 20-30 minutes. ? Remove the heat if your skin turns bright red. This is especially important if you are unable to feel pain, heat, or cold. You may have a greater risk of getting burned. Activity  Rest and return to your normal activities as told by your health  care provider. Ask your health care provider what activities are safe for you.  Avoid activities that take a lot of energy for as long as told by your health care provider. General instructions  Take over-the-counter and prescription medicines only as told by your health care provider.  Donot drive or use heavy machinery while taking prescription pain medicine.  Do not use any products that contain nicotine or tobacco, such as cigarettes and e-cigarettes. If you need help quitting, ask your health care provider.  Keep all follow-up  visits as told by your health care provider. This is important. How is this prevented?  Use correct form when playing sports and lifting heavy objects.  Use good posture when sitting and standing.  Maintain a healthy weight.  Sleep on a mattress with medium firmness to support your back.  Be safe and responsible while being active to avoid falls.  Do at least 150 minutes of moderate-intensity exercise each week, such as brisk walking or water aerobics. Try a form of exercise that takes stress off your back, such as swimming or stationary cycling.  Maintain physical fitness, including: ? Strength. ? Flexibility. ? Cardiovascular fitness. ? Endurance. Contact a health care provider if:  Your back pain does not improve after 6 weeks of treatment.  Your symptoms get worse. Get help right away if:  Your back pain is severe.  You cannot stand or walk.  You have difficulty controlling when you urinate or when you have a bowel movement.  You feel nauseous or you vomit.  Your feet get very cold.  You have numbness, tingling, weakness, or problems using your arms or legs.  You develop any of the following: ? Shortness of breath. ? Dizziness. ? Pain in your legs. ? Weakness in your buttocks or legs. ? Discoloration of the skin on your toes or legs. This information is not intended to replace advice given to you by your health care provider. Make sure you discuss any questions you have with your health care provider. Document Released: 11/10/2004 Document Revised: 05/25/2018 Document Reviewed: 07/05/2015 Elsevier Patient Education  2020 [employer].

## 2019-04-05 DIAGNOSIS — Z03818 Encounter for observation for suspected exposure to other biological agents ruled out: Secondary | ICD-10-CM | POA: Diagnosis not present

## 2019-04-05 DIAGNOSIS — Z20828 Contact with and (suspected) exposure to other viral communicable diseases: Secondary | ICD-10-CM | POA: Diagnosis not present

## 2019-10-05 IMAGING — DX DG RIBS W/ CHEST 3+V*L*
3 series · 3 of 3 positions shown · non-contrast
Comparison: None.

CLINICAL DATA: Pt with Lt rib pain for over 1 week after falling
onto his left side. Pain with a deep breath. Non-smoker.

EXAM:
LEFT RIBS AND CHEST - 3+ VIEW

[chest pa]
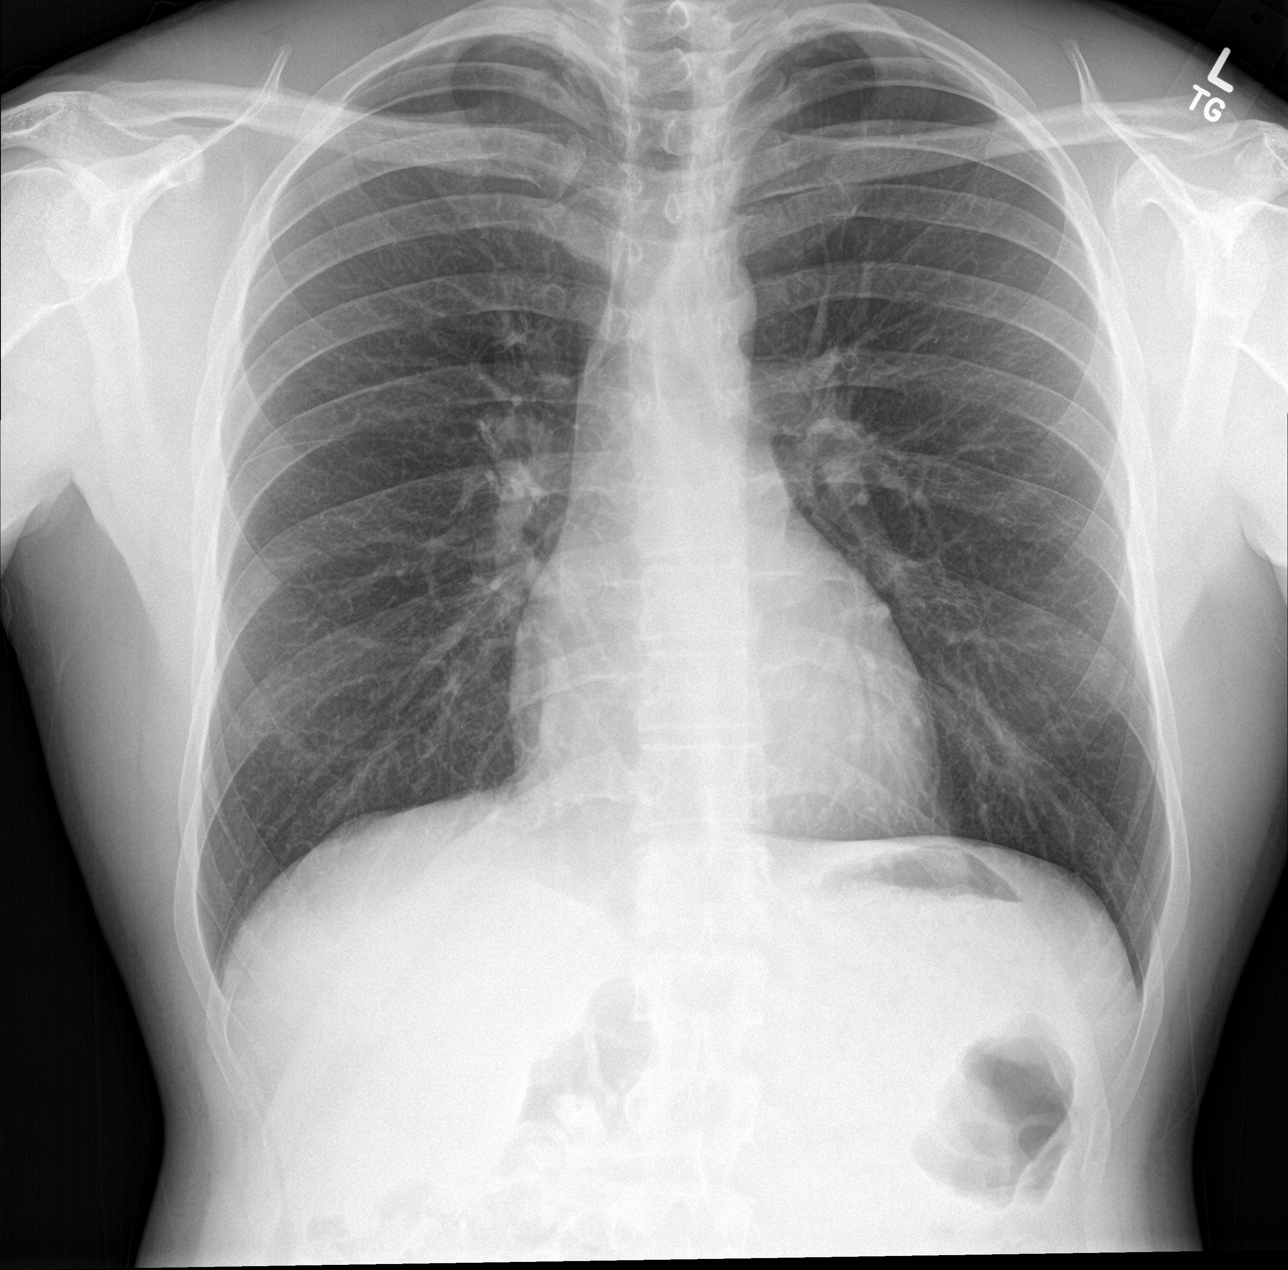

[rib pa]
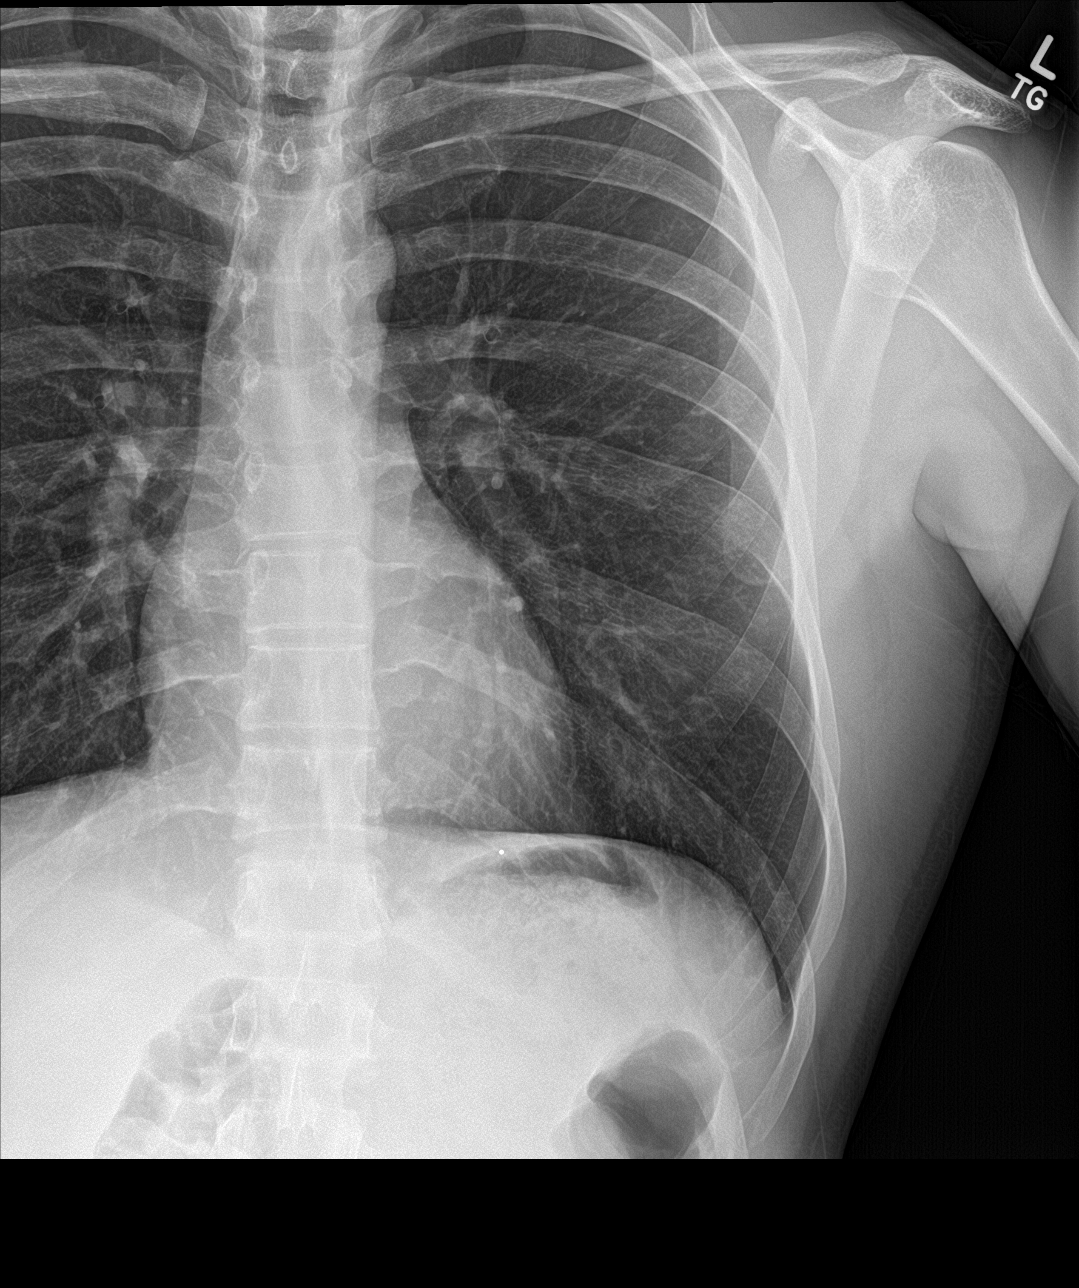

[rib pa obl]
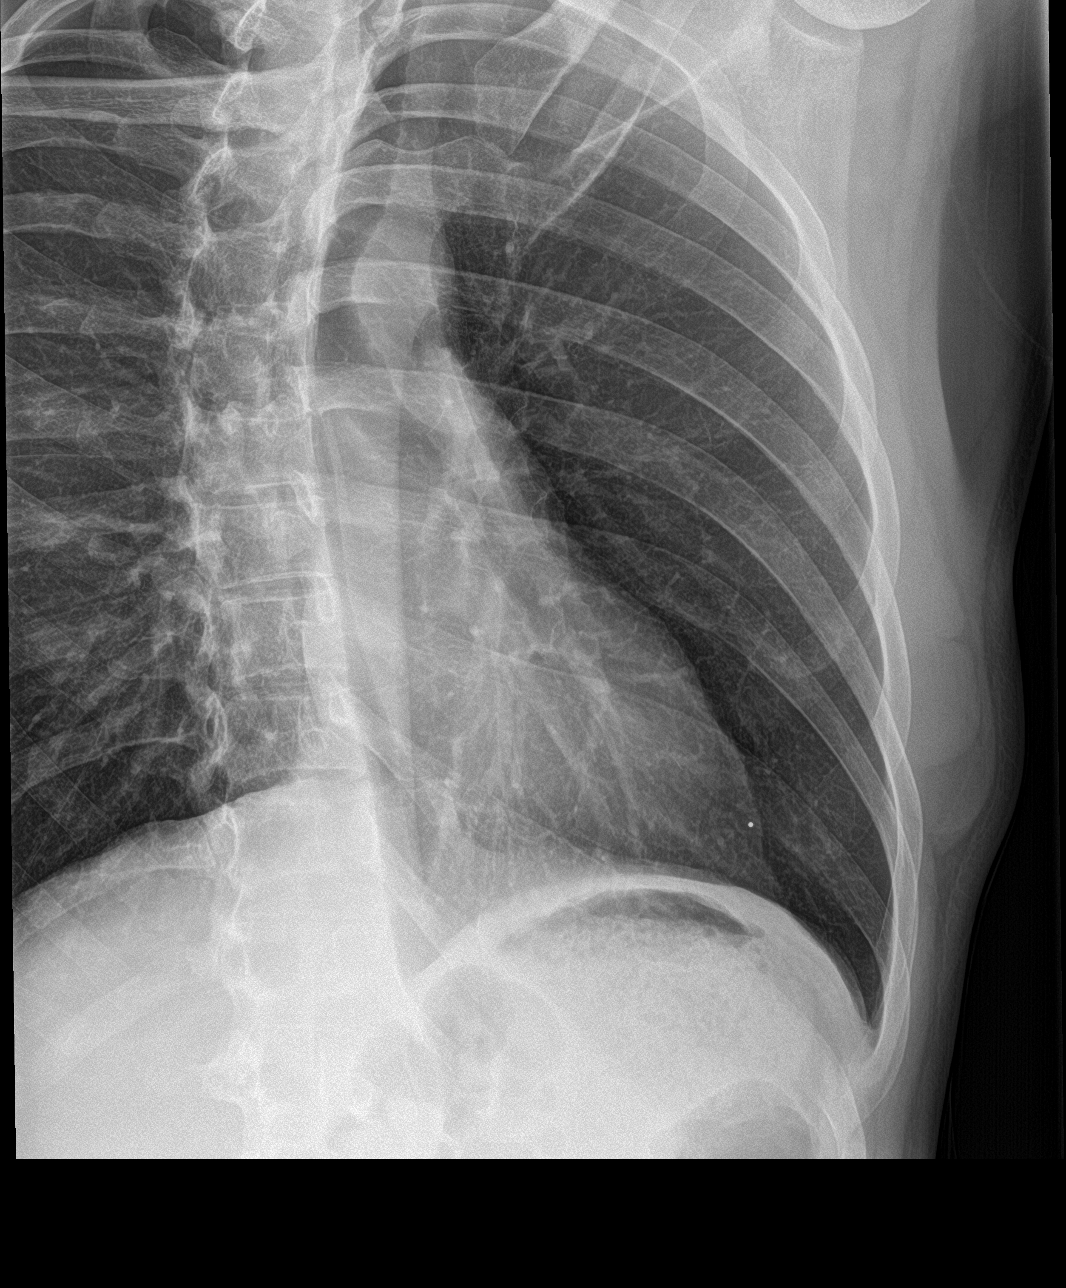

[3 of 3 positions shown; findings below may reference images not displayed]

FINDINGS: No fracture or other bone lesions are seen involving the ribs. There
is no evidence of pneumothorax or pleural effusion. Both lungs are
clear. Heart size and mediastinal contours are within normal limits.
IMPRESSION: Negative.

## 2022-12-12 ENCOUNTER — Ambulatory Visit (INDEPENDENT_AMBULATORY_CARE_PROVIDER_SITE_OTHER): Payer: BC Managed Care – PPO | Admitting: Family Medicine

## 2022-12-12 VITALS — BP 116/68 | HR 55 | Temp 98.1°F | Resp 18 | Ht 72.0 in | Wt 170.3 lb

## 2022-12-12 DIAGNOSIS — Z0000 Encounter for general adult medical examination without abnormal findings: Secondary | ICD-10-CM

## 2022-12-12 DIAGNOSIS — Z136 Encounter for screening for cardiovascular disorders: Secondary | ICD-10-CM

## 2022-12-12 DIAGNOSIS — Z13220 Encounter for screening for lipoid disorders: Secondary | ICD-10-CM | POA: Diagnosis not present

## 2022-12-12 DIAGNOSIS — R7302 Impaired glucose tolerance (oral): Secondary | ICD-10-CM | POA: Diagnosis not present

## 2022-12-12 DIAGNOSIS — Z7689 Persons encountering health services in other specified circumstances: Secondary | ICD-10-CM

## 2022-12-12 NOTE — Progress Notes (Signed)
Complete physical exam and Establish Care  Patient: Troy Oconnell   DOB: 08/17/1985   37 y.o. Male  MRN: [id number]  Subjective:    Chief Complaint  Patient presents with   Establish Care    Patient is here to establish care with new PCP    Troy Oconnell is a 37 y.o. male who presents today for a complete physical exam. He reports consuming a general diet.  Lifting weight and walking daily  He generally feels well. He reports sleeping well. He does have additional problems to discuss today.  No concerns today.   Most recent fall risk assessment:    12/12/2022    3:20 PM  Fall Risk   Falls in the past year? 0  Number falls in past yr: 0  Injury with Fall? 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/10/2013   Open wound of knee, leg (except thigh), and ankle, complicated 06/05/2012   Arteriovenous malformation of lower extremity 05/04/2012   Past Medical History:  Diagnosis Date   Congenital lower limb vessel anomaly    Arteriovenous malformation of lower extremity   Past Surgical History:  Procedure Laterality Date   COLONOSCOPY     Social History   Tobacco Use   Smoking status: Never    Passive exposure: Never   Smokeless tobacco: Never  Vaping Use   Vaping status: Never Used  Substance Use Topics   Alcohol use: No   Drug use: No   Family Status  Relation Name Status   Mother  Alive   Father  Alive   PGM  (Not Specified)   PGF  (Not Specified)   MGF Troy Oconnell (Not Specified)  No partnership data on file   Family History  Problem Relation Age of Onset   AVM Mother    High blood pressure Paternal Grandmother    High blood pressure Paternal Grandfather    Cancer  Maternal Grandfather    No Known Allergies    Patient Care Team: Suzan Slick, MD as PCP - General (Family Medicine)   Outpatient Medications Prior to Visit  Medication Sig   [DISCONTINUED] cyclobenzaprine (FLEXERIL) 10 MG tablet Take 1 tablet (10 mg total) by mouth 3 (three) times daily as needed for muscle spasms.   No facility-administered medications prior to visit.    Review of Systems  All other systems reviewed and are negative.         Objective:     BP 116/68   Pulse (!) 55   Temp 98.1 F (36.7 C) (Oral)   Resp 18   Ht 6' (1.829 m)   Wt 170 lb 4.8 oz (77.2 kg)   SpO2 100%   BMI 23.10 kg/m  BP Readings from Last 3 Encounters:  12/12/22 116/68  11/01/18 116/64  10/19/18 (!) 109/47      Physical Exam Vitals and nursing note reviewed.  Constitutional:      Appearance: Normal appearance. He is normal weight.  HENT:     Head: Normocephalic and atraumatic.     Right Ear: Tympanic membrane, ear canal and  external ear normal.     Left Ear: Tympanic membrane, ear canal and external ear normal.     Nose: Nose normal.     Mouth/Throat:     Mouth: Mucous membranes are moist.     Pharynx: Oropharynx is clear.  Eyes:     Conjunctiva/sclera: Conjunctivae normal.     Pupils: Pupils are equal, round, and reactive to light.  Cardiovascular:     Rate and Rhythm: Normal rate and regular rhythm.     Pulses: Normal pulses.     Heart sounds: Normal heart sounds.  Pulmonary:     Effort: Pulmonary effort is normal.     Breath sounds: Normal breath sounds.  Abdominal:     General: Abdomen is flat. Bowel sounds are normal.  Skin:    General: Skin is warm.     Capillary Refill: Capillary refill takes less than 2 seconds.  Neurological:     General: No focal deficit present.     Mental Status: He is alert and oriented to person, place, and time. Mental status is at baseline.  Psychiatric:        Mood and Affect: Mood normal.        Behavior: Behavior normal.         Thought Content: Thought content normal.        Judgment: Judgment normal.      No results found for any visits on 12/12/22.      Assessment & Plan:    Routine Health Maintenance and Physical Exam  Immunization History  Administered Date(s) Administered   DTaP 02/25/1988, 04/25/1988, 06/27/1988, 07/04/1989, 07/27/1993   HIB (PRP-OMP) 04/06/1989   Hepatitis A 09/01/2017   Hepatitis B 10/22/1991, 11/21/1991, 04/20/1992   IPV 02/25/1988, 04/25/1988, 07/04/1989, 07/27/1993   Influenza-Unspecified 12/14/2018   MMR 04/06/1989, 07/27/1993   Td 02/14/2006   Tdap 09/01/2017   Typhoid Inactivated 09/01/2017   Varicella 06/19/1996    Health Maintenance  Topic Date Due   COVID-19 Vaccine (1) Never done   INFLUENZA VACCINE  09/15/2022   DTaP/Tdap/Td (8 - Td or Tdap) 09/02/2027   Hepatitis C Screening  Completed   HIV Screening  Completed   HPV VACCINES  Aged Out    Discussed health benefits of physical activity, and encouraged him to engage in regular exercise appropriate for his age and condition.  Problem List Items Addressed This Visit   None Visit Diagnoses     Annual physical exam    -  Primary   Encounter to establish care with new doctor       Encounter for lipid screening for cardiovascular disease       Relevant Orders   Lipid panel   Impaired glucose tolerance       Relevant Orders   CBC with Differential/Platelet   Comprehensive metabolic panel   Hemoglobin A1c      Return in about 1 year (around 12/12/2023) for Annual Physical. Screening labs today See in 1 year sooner prn    Suzan Slick, MD

## 2022-12-13 ENCOUNTER — Encounter: Payer: Self-pay | Admitting: Family Medicine

## 2022-12-13 LAB — CBC WITH DIFFERENTIAL/PLATELET
Basophils Absolute: 0 10*3/uL (ref 0.0–0.2)
Basos: 1 %
EOS (ABSOLUTE): 0.1 10*3/uL (ref 0.0–0.4)
Eos: 1 %
Hematocrit: 43.4 % (ref 37.5–51.0)
Hemoglobin: 14.3 g/dL (ref 13.0–17.7)
Immature Grans (Abs): 0 10*3/uL (ref 0.0–0.1)
Immature Granulocytes: 0 %
Lymphocytes Absolute: 1.8 10*3/uL (ref 0.7–3.1)
Lymphs: 50 %
MCH: 29.5 pg (ref 26.6–33.0)
MCHC: 32.9 g/dL (ref 31.5–35.7)
MCV: 90 fL (ref 79–97)
Monocytes Absolute: 0.3 10*3/uL (ref 0.1–0.9)
Monocytes: 7 %
Neutrophils Absolute: 1.5 10*3/uL (ref 1.4–7.0)
Neutrophils: 41 %
Platelets: 216 10*3/uL (ref 150–450)
RBC: 4.85 x10E6/uL (ref 4.14–5.80)
RDW: 11.6 % (ref 11.6–15.4)
WBC: 3.6 10*3/uL (ref 3.4–10.8)

## 2022-12-13 LAB — LIPID PANEL
Chol/HDL Ratio: 3.1 ratio (ref 0.0–5.0)
Cholesterol, Total: 179 mg/dL (ref 100–199)
HDL: 58 mg/dL (ref >39)
LDL Chol Calc (NIH): 104 mg/dL — ABNORMAL HIGH (ref 0–99)
Triglycerides: 94 mg/dL (ref 0–149)
VLDL Cholesterol Cal: 17 mg/dL (ref 5–40)

## 2022-12-13 LAB — COMPREHENSIVE METABOLIC PANEL
ALT: 18 [IU]/L (ref 0–44)
AST: 17 [IU]/L (ref 0–40)
Albumin: 4.6 g/dL (ref 4.1–5.1)
Alkaline Phosphatase: 50 [IU]/L (ref 44–121)
BUN/Creatinine Ratio: 17 (ref 9–20)
BUN: 18 mg/dL (ref 6–20)
Bilirubin Total: 1 mg/dL (ref 0.0–1.2)
CO2: 25 mmol/L (ref 20–29)
Calcium: 9.9 mg/dL (ref 8.7–10.2)
Chloride: 99 mmol/L (ref 96–106)
Creatinine, Ser: 1.09 mg/dL (ref 0.76–1.27)
Globulin, Total: 3 g/dL (ref 1.5–4.5)
Glucose: 119 mg/dL — ABNORMAL HIGH (ref 70–99)
Potassium: 4.4 mmol/L (ref 3.5–5.2)
Sodium: 138 mmol/L (ref 134–144)
Total Protein: 7.6 g/dL (ref 6.0–8.5)
eGFR: 91 mL/min/{1.73_m2} (ref >59)

## 2022-12-13 LAB — HEMOGLOBIN A1C
Est. average glucose Bld gHb Est-mCnc: 120 mg/dL
Hgb A1c MFr Bld: 5.8 % — ABNORMAL HIGH (ref 4.8–5.6)

## 2023-07-11 ENCOUNTER — Ambulatory Visit
Admission: RE | Admit: 2023-07-11 | Discharge: 2023-07-11 | Disposition: A | Discharge status: Home / Self Care | Source: Ambulatory Visit | Attending: Family Medicine | Admitting: Family Medicine

## 2023-07-11 ENCOUNTER — Ambulatory Visit (INDEPENDENT_AMBULATORY_CARE_PROVIDER_SITE_OTHER)

## 2023-07-11 VITALS — BP 126/65 | HR 110 | Temp 98.1°F | Resp 16

## 2023-07-11 DIAGNOSIS — S39012A Strain of muscle, fascia and tendon of lower back, initial encounter: Secondary | ICD-10-CM

## 2023-07-11 DIAGNOSIS — M545 Low back pain, unspecified: Secondary | ICD-10-CM | POA: Diagnosis not present

## 2023-07-11 DIAGNOSIS — M5459 Other low back pain: Secondary | ICD-10-CM

## 2023-07-11 DIAGNOSIS — M48061 Spinal stenosis, lumbar region without neurogenic claudication: Secondary | ICD-10-CM | POA: Diagnosis not present

## 2023-07-11 DIAGNOSIS — M62830 Muscle spasm of back: Secondary | ICD-10-CM

## 2023-07-11 DIAGNOSIS — M47816 Spondylosis without myelopathy or radiculopathy, lumbar region: Secondary | ICD-10-CM | POA: Diagnosis not present

## 2023-07-11 DIAGNOSIS — M5442 Lumbago with sciatica, left side: Secondary | ICD-10-CM

## 2023-07-11 MED ORDER — PREDNISONE 10 MG (21) PO TBPK: ORAL_TABLET | Freq: Every day | ORAL | 0 refills | Status: AC

## 2023-07-11 MED ORDER — CELECOXIB 200 MG PO CAPS: 200.0000 mg | ORAL_CAPSULE | Freq: Every day | ORAL | 0 refills | Status: AC

## 2023-07-11 MED ORDER — OXYCODONE-ACETAMINOPHEN 7.5-325 MG PO TABS: 1.0000 | ORAL_TABLET | Freq: Three times a day (TID) | ORAL | 0 refills | Status: AC | PRN

## 2023-07-11 MED ORDER — METHOCARBAMOL 500 MG PO TABS: 500.0000 mg | ORAL_TABLET | Freq: Three times a day (TID) | ORAL | 0 refills | Status: AC | PRN

## 2023-07-11 NOTE — ED Provider Notes (Signed)
 [facility]    CSN: [id number] Arrival date & time: 07/11/23  1112      History   Chief Complaint Chief Complaint  Patient presents with   Back Pain    Shoots down my leg - Entered by patient    HPI Troy Oconnell is a 38 y.o. male.   HPI 38 year old male presents with left-sided lower back pain that shoots down left leg for 8 weeks and has progressively worsened over time.  Reports has taken Motrin and Aleve for symptoms and has been using heat and ice with little to no relief.  Patient reports he cannot get lower back relief when standing, seated or when lying down.  Patient is accompanied by his wife today.  PMH significant for seborrheic keratosis and arteriovenous malformation of lower extremity.  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 Authorizing Provider  celecoxib (CELEBREX) 200 MG capsule Take 1 capsule (200 mg total) by mouth daily for 15 days. 07/11/23 07/26/23 Yes Leonides Ramp, FNP  methocarbamol (ROBAXIN) 500 MG tablet Take 1 tablet (500 mg total) by mouth 3 (three) times daily as needed for up to 7 days. 07/11/23 07/18/23 Yes Leonides Ramp, FNP  oxyCODONE-acetaminophen (PERCOCET) 7.5-325 MG tablet Take 1 tablet by mouth every 8 (eight) hours as needed for up to 5 days for severe pain (pain score 7-10). 07/11/23 07/16/23 Yes Leonides Ramp, FNP  predniSONE (STERAPRED UNI-PAK 21 TAB) 10 MG (21) TBPK tablet Take by mouth daily. Take 6 tabs by mouth daily  for 2 days, then 5 tabs  for 2 days, then 4 tabs for 2 days, then 3 tabs for 2 days, 2 tabs for 2 days, then 1 tab by mouth daily for 2 days 07/11/23  Yes Leonides Ramp, FNP    Family History Family History  Problem Relation Age of Onset   AVM Mother    High blood pressure Paternal Grandmother    High blood pressure Paternal Grandfather    Cancer Maternal Grandfather     Social History Social History   Tobacco Use   Smoking status: Never    Passive exposure: Never   Smokeless tobacco: Never  Vaping Use   Vaping status: Never Used  Substance Use Topics   Alcohol use: No   Drug use: No     Allergies   Patient has no known allergies.   Review of Systems Review of Systems  Musculoskeletal:  Positive for back pain.  All other systems reviewed and are negative.    Physical Exam Triage Vital Signs ED Triage Vitals  Encounter Vitals Group     BP      Systolic BP Percentile      Diastolic BP Percentile      Pulse      Resp      Temp      Temp src      SpO2      Weight      Height  Head Circumference      Peak Flow      Pain Score      Pain Loc      Pain Education      Exclude from Growth Chart    No data found.  Updated Vital Signs BP 126/65   Pulse (!) 110   Temp 98.1 F (36.7 C)   Resp 16   SpO2 98%    Physical Exam Vitals and nursing note reviewed.  Constitutional:      Appearance: Normal appearance. He is normal weight. He is ill-appearing.  HENT:     Head: Normocephalic and atraumatic.     Mouth/Throat:     Mouth: Mucous membranes are moist.     Pharynx: Oropharynx is clear.  Eyes:     Extraocular Movements: Extraocular movements intact.     Conjunctiva/sclera: Conjunctivae normal.     Pupils: Pupils are equal, round, and reactive to light.  Cardiovascular:     Rate and Rhythm: Normal rate and regular rhythm.     Pulses: Normal pulses.     Heart sounds: Normal heart sounds.  Pulmonary:     Effort: Pulmonary effort is normal.     Breath sounds: Normal  breath sounds. No wheezing, rhonchi or rales.  Musculoskeletal:        General: Normal range of motion.     Cervical back: Normal range of motion and neck supple.     Comments: Patient reporting left-sided lower back pain that radiates to left buttocks left hip and left upper leg, patient standing in exam room, exam limited due to pain today  Skin:    General: Skin is warm and dry.  Neurological:     General: No focal deficit present.     Mental Status: He is alert and oriented to person, place, and time. Mental status is at baseline.  Psychiatric:        Mood and Affect: Mood normal.        Behavior: Behavior normal.      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 Lumbar alignment is normal. Vertebral body heights are maintained. Mild disc space narrowing at L4-L5. 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 Mild disc space narrowing at L4-L5. Electronically Signed   By: Esmeralda Hedge M.D.   On: 07/11/2023 15:53    Procedures Procedures (including critical care time)  Medications Ordered in UC Medications - No data to display  Initial Impression / Assessment and Plan / UC Course  I have reviewed the triage vital signs and the nursing notes.  Pertinent labs & imaging results that were available during my care of the patient were reviewed by me and considered in my medical decision making (see chart for details).     MDM: 1.  Acute midline low back pain with left-sided sciatica-lumbar spine x-ray results revealed above, Rx'd Percocet 7.5/3 and 25 mg tablet: Take 1 tablet every 8 hours, as needed for severe/acute left-sided low back pain,  Rx'd Sterapred Unipak (21 tab 10 mg taper); 2.  Strain of lumbar region, initial encounter-Rx'd Celebrex 200 mg capsule: Take 1 capsule daily x 15 days; 3.  Muscle spasm of back-Rx'd Robaxin 500  mg tablet: Take 1 tablet 3 times daily, as needed for back spasms. Advised patient to take medications as directed with food to completion.  Advised patient may take Robaxin daily or as needed for accompanying muscle spasms.  Advised may take Percocet daily or as needed for acute/severe left-sided low back pain.  Patient advised of sedative effects of this medication.  Encouraged to increase daily water intake to 64 ounces per day while taking these medications.  Advised if symptoms worsen and/or unresolved please follow-up with your PCP or [facility] for further evaluation contact information provided with this AVS today.  Patient discharged home, hemodynamically stable. Final Clinical Impressions(s) / UC Diagnoses   Final diagnoses:  Acute midline low back pain with left-sided sciatica  Muscle spasm of back  Strain of lumbar region, initial encounter     Discharge Instructions      Advised patient to take medications as directed with food to completion.  Advised patient may take Robaxin daily or as needed for accompanying muscle spasms.  Advised may take Percocet daily or as needed for acute/severe left-sided low back pain.  Patient advised of sedative effects of this medication.  Encouraged to increase daily water intake to 64 ounces per day while taking these medications.  Advised if symptoms worsen and/or unresolved please follow-up with your PCP or [facility] for further evaluation contact information provided with this AVS today.   ED Prescriptions     Medication Sig Dispense Auth. Provider   celecoxib (CELEBREX) 200 MG capsule Take 1 capsule (200 mg total) by mouth daily for 15 days. 15 capsule Magdaline Zollars, FNP   predniSONE (STERAPRED UNI-PAK 21 TAB) 10 MG (21) TBPK  tablet Take by mouth daily. Take 6 tabs by mouth daily  for 2 days, then 5 tabs for 2 days, then 4 tabs for 2 days, then 3 tabs for 2 days, 2 tabs for 2 days, then 1 tab by mouth daily for 2 days 42 tablet Leonides Ramp, FNP   methocarbamol (ROBAXIN) 500 MG tablet Take 1 tablet (500 mg total) by mouth 3 (three) times daily as needed for up to 7 days. 21 tablet Winter Jocelyn, FNP   oxyCODONE-acetaminophen (PERCOCET) 7.5-325 MG tablet Take 1 tablet by mouth every 8 (eight) hours as needed for up to 5 days for severe pain (pain score 7-10). 15 tablet Ayerim Berquist, FNP      I have reviewed the PDMP during this encounter.   Leonides Ramp, FNP 07/11/23 [phone]

## 2023-07-11 NOTE — Discharge Instructions (Addendum)
 Advised patient to take medications as directed with food to completion.  Advised patient may take Robaxin daily or as needed for accompanying muscle spasms.  Advised may take Percocet daily or as needed for acute/severe left-sided low back pain.  Patient advised of sedative effects of this medication.  Encouraged to increase daily water intake to 64 ounces per day while taking these medications.  Advised if symptoms worsen and/or unresolved please follow-up with your PCP or [facility] for further evaluation contact information provided with this AVS today.

## 2023-07-11 NOTE — ED Triage Notes (Signed)
 Pt presents to uc with co left sided lower back pain shooting down the back of the left leg since 8 weeks ago that has progressively worsened over time. Pt has taken motrin and aleeve for symptoms.  Pt reports he has been using ice and heat

## 2023-07-13 ENCOUNTER — Ambulatory Visit (HOSPITAL_COMMUNITY): Payer: Self-pay

## 2023-07-17 DIAGNOSIS — M5442 Lumbago with sciatica, left side: Secondary | ICD-10-CM | POA: Diagnosis not present

## 2023-07-20 DIAGNOSIS — M6281 Muscle weakness (generalized): Secondary | ICD-10-CM | POA: Diagnosis not present

## 2023-07-20 DIAGNOSIS — M5416 Radiculopathy, lumbar region: Secondary | ICD-10-CM | POA: Diagnosis not present

## 2023-07-20 DIAGNOSIS — M25552 Pain in left hip: Secondary | ICD-10-CM | POA: Diagnosis not present

## 2023-07-20 DIAGNOSIS — M2560 Stiffness of unspecified joint, not elsewhere classified: Secondary | ICD-10-CM | POA: Diagnosis not present

## 2023-07-24 DIAGNOSIS — M2560 Stiffness of unspecified joint, not elsewhere classified: Secondary | ICD-10-CM | POA: Diagnosis not present

## 2023-07-24 DIAGNOSIS — M6281 Muscle weakness (generalized): Secondary | ICD-10-CM | POA: Diagnosis not present

## 2023-07-24 DIAGNOSIS — M5416 Radiculopathy, lumbar region: Secondary | ICD-10-CM | POA: Diagnosis not present

## 2023-07-24 DIAGNOSIS — M25552 Pain in left hip: Secondary | ICD-10-CM | POA: Diagnosis not present

## 2023-07-28 DIAGNOSIS — M5416 Radiculopathy, lumbar region: Secondary | ICD-10-CM | POA: Diagnosis not present

## 2023-07-28 DIAGNOSIS — M2560 Stiffness of unspecified joint, not elsewhere classified: Secondary | ICD-10-CM | POA: Diagnosis not present

## 2023-07-28 DIAGNOSIS — M6281 Muscle weakness (generalized): Secondary | ICD-10-CM | POA: Diagnosis not present

## 2023-07-28 DIAGNOSIS — M25552 Pain in left hip: Secondary | ICD-10-CM | POA: Diagnosis not present

## 2023-07-31 NOTE — Progress Notes (Deleted)
    Troy Oconnell D.[facility] [address] [place] [address] Phone: [phone]   Assessment and Plan:     There are no diagnoses linked to this encounter.  ***   Pertinent previous records reviewed include ***    Follow Up: ***     Subjective:    Chief Complaint: LBP and glute plain  HPI:   08/01/23 Patient is a 38 year old male with a complaint of low back and glute plain. Patient states  Duration? Did you have an Injury to cause this pain? Taking Medication for pain? Numbness or Tingling? Does the pain Radiate?  Altered gait or use? ROM/ impairment of movement?   Relevant Historical Information: ***  Additional pertinent review of systems negative.   Current Outpatient Medications:    predniSONE  (STERAPRED UNI-PAK 21 TAB) 10 MG (21) TBPK tablet, Take by mouth daily. Take 6 tabs by mouth daily  for 2 days, then 5 tabs for 2 days, then 4 tabs for 2 days, then 3 tabs for 2 days, 2 tabs for 2 days, then 1 tab by mouth daily for 2 days, Disp: 42 tablet, Rfl: 0   Objective:     There were no vitals filed for this visit.    There is no height or weight on file to calculate BMI.    Physical Exam:    ***   Electronically signed by:  Marshall Skeeter D.[facility] 8:55 AM 07/31/23

## 2023-08-01 ENCOUNTER — Ambulatory Visit: Admitting: Sports Medicine

## 2023-08-03 DIAGNOSIS — M5416 Radiculopathy, lumbar region: Secondary | ICD-10-CM | POA: Diagnosis not present

## 2023-08-03 DIAGNOSIS — M6281 Muscle weakness (generalized): Secondary | ICD-10-CM | POA: Diagnosis not present

## 2023-08-03 DIAGNOSIS — M25552 Pain in left hip: Secondary | ICD-10-CM | POA: Diagnosis not present

## 2023-08-03 DIAGNOSIS — M2560 Stiffness of unspecified joint, not elsewhere classified: Secondary | ICD-10-CM | POA: Diagnosis not present

## 2023-08-24 DIAGNOSIS — M5442 Lumbago with sciatica, left side: Secondary | ICD-10-CM | POA: Diagnosis not present

## 2023-09-04 DIAGNOSIS — M5442 Lumbago with sciatica, left side: Secondary | ICD-10-CM | POA: Diagnosis not present

## 2023-09-06 DIAGNOSIS — M62830 Muscle spasm of back: Secondary | ICD-10-CM | POA: Diagnosis not present

## 2023-09-06 DIAGNOSIS — M9903 Segmental and somatic dysfunction of lumbar region: Secondary | ICD-10-CM | POA: Diagnosis not present

## 2023-09-06 DIAGNOSIS — M545 Low back pain, unspecified: Secondary | ICD-10-CM | POA: Diagnosis not present

## 2023-09-06 DIAGNOSIS — M9901 Segmental and somatic dysfunction of cervical region: Secondary | ICD-10-CM | POA: Diagnosis not present

## 2023-09-07 DIAGNOSIS — M9903 Segmental and somatic dysfunction of lumbar region: Secondary | ICD-10-CM | POA: Diagnosis not present

## 2023-09-07 DIAGNOSIS — M545 Low back pain, unspecified: Secondary | ICD-10-CM | POA: Diagnosis not present

## 2023-09-07 DIAGNOSIS — M9901 Segmental and somatic dysfunction of cervical region: Secondary | ICD-10-CM | POA: Diagnosis not present

## 2023-09-07 DIAGNOSIS — M62830 Muscle spasm of back: Secondary | ICD-10-CM | POA: Diagnosis not present

## 2023-09-11 DIAGNOSIS — M9901 Segmental and somatic dysfunction of cervical region: Secondary | ICD-10-CM | POA: Diagnosis not present

## 2023-09-11 DIAGNOSIS — M9903 Segmental and somatic dysfunction of lumbar region: Secondary | ICD-10-CM | POA: Diagnosis not present

## 2023-09-11 DIAGNOSIS — M545 Low back pain, unspecified: Secondary | ICD-10-CM | POA: Diagnosis not present

## 2023-09-11 DIAGNOSIS — M62830 Muscle spasm of back: Secondary | ICD-10-CM | POA: Diagnosis not present

## 2023-09-13 DIAGNOSIS — M62830 Muscle spasm of back: Secondary | ICD-10-CM | POA: Diagnosis not present

## 2023-09-13 DIAGNOSIS — M545 Low back pain, unspecified: Secondary | ICD-10-CM | POA: Diagnosis not present

## 2023-09-13 DIAGNOSIS — M9903 Segmental and somatic dysfunction of lumbar region: Secondary | ICD-10-CM | POA: Diagnosis not present

## 2023-09-13 DIAGNOSIS — M9901 Segmental and somatic dysfunction of cervical region: Secondary | ICD-10-CM | POA: Diagnosis not present

## 2023-09-14 DIAGNOSIS — M545 Low back pain, unspecified: Secondary | ICD-10-CM | POA: Diagnosis not present

## 2023-09-14 DIAGNOSIS — M62830 Muscle spasm of back: Secondary | ICD-10-CM | POA: Diagnosis not present

## 2023-09-14 DIAGNOSIS — M9903 Segmental and somatic dysfunction of lumbar region: Secondary | ICD-10-CM | POA: Diagnosis not present

## 2023-09-14 DIAGNOSIS — M9901 Segmental and somatic dysfunction of cervical region: Secondary | ICD-10-CM | POA: Diagnosis not present

## 2023-09-18 DIAGNOSIS — M9903 Segmental and somatic dysfunction of lumbar region: Secondary | ICD-10-CM | POA: Diagnosis not present

## 2023-09-18 DIAGNOSIS — M9901 Segmental and somatic dysfunction of cervical region: Secondary | ICD-10-CM | POA: Diagnosis not present

## 2023-09-18 DIAGNOSIS — M62830 Muscle spasm of back: Secondary | ICD-10-CM | POA: Diagnosis not present

## 2023-09-18 DIAGNOSIS — M545 Low back pain, unspecified: Secondary | ICD-10-CM | POA: Diagnosis not present

## 2023-09-20 DIAGNOSIS — M545 Low back pain, unspecified: Secondary | ICD-10-CM | POA: Diagnosis not present

## 2023-09-20 DIAGNOSIS — M9903 Segmental and somatic dysfunction of lumbar region: Secondary | ICD-10-CM | POA: Diagnosis not present

## 2023-09-20 DIAGNOSIS — M9901 Segmental and somatic dysfunction of cervical region: Secondary | ICD-10-CM | POA: Diagnosis not present

## 2023-09-20 DIAGNOSIS — M62830 Muscle spasm of back: Secondary | ICD-10-CM | POA: Diagnosis not present

## 2023-09-21 DIAGNOSIS — M545 Low back pain, unspecified: Secondary | ICD-10-CM | POA: Diagnosis not present

## 2023-09-21 DIAGNOSIS — M9901 Segmental and somatic dysfunction of cervical region: Secondary | ICD-10-CM | POA: Diagnosis not present

## 2023-09-21 DIAGNOSIS — M62830 Muscle spasm of back: Secondary | ICD-10-CM | POA: Diagnosis not present

## 2023-09-21 DIAGNOSIS — M9903 Segmental and somatic dysfunction of lumbar region: Secondary | ICD-10-CM | POA: Diagnosis not present

## 2023-09-25 DIAGNOSIS — M62830 Muscle spasm of back: Secondary | ICD-10-CM | POA: Diagnosis not present

## 2023-09-25 DIAGNOSIS — M9903 Segmental and somatic dysfunction of lumbar region: Secondary | ICD-10-CM | POA: Diagnosis not present

## 2023-09-25 DIAGNOSIS — M545 Low back pain, unspecified: Secondary | ICD-10-CM | POA: Diagnosis not present

## 2023-09-25 DIAGNOSIS — M9901 Segmental and somatic dysfunction of cervical region: Secondary | ICD-10-CM | POA: Diagnosis not present

## 2023-09-27 DIAGNOSIS — M9903 Segmental and somatic dysfunction of lumbar region: Secondary | ICD-10-CM | POA: Diagnosis not present

## 2023-09-27 DIAGNOSIS — M62830 Muscle spasm of back: Secondary | ICD-10-CM | POA: Diagnosis not present

## 2023-09-27 DIAGNOSIS — M545 Low back pain, unspecified: Secondary | ICD-10-CM | POA: Diagnosis not present

## 2023-09-27 DIAGNOSIS — M9901 Segmental and somatic dysfunction of cervical region: Secondary | ICD-10-CM | POA: Diagnosis not present
